# Patient Record
Sex: Female | Born: 1990 | Race: White | Hispanic: No | Marital: Single | Smoking: Never smoker
Health system: Southern US, Community
[De-identification: ages and names within clinical notes are randomized; demographics above are authoritative.]

## PROBLEM LIST (undated history)

## (undated) ENCOUNTER — Inpatient Hospital Stay (HOSPITAL_COMMUNITY): Payer: Self-pay

## (undated) DIAGNOSIS — N6320 Unspecified lump in the left breast, unspecified quadrant: Secondary | ICD-10-CM

## (undated) DIAGNOSIS — A630 Anogenital (venereal) warts: Secondary | ICD-10-CM

## (undated) DIAGNOSIS — K219 Gastro-esophageal reflux disease without esophagitis: Secondary | ICD-10-CM

## (undated) DIAGNOSIS — Z8489 Family history of other specified conditions: Secondary | ICD-10-CM

## (undated) DIAGNOSIS — O99619 Diseases of the digestive system complicating pregnancy, unspecified trimester: Secondary | ICD-10-CM

## (undated) DIAGNOSIS — T8859XA Other complications of anesthesia, initial encounter: Secondary | ICD-10-CM

## (undated) DIAGNOSIS — T4145XA Adverse effect of unspecified anesthetic, initial encounter: Secondary | ICD-10-CM

## (undated) HISTORY — PX: WISDOM TOOTH EXTRACTION: SHX21

---

## 2009-11-24 ENCOUNTER — Inpatient Hospital Stay (HOSPITAL_COMMUNITY): Admission: AD | Admit: 2009-11-24 | Discharge: 2009-11-24 | Payer: Self-pay | Admitting: Obstetrics & Gynecology

## 2009-11-24 ENCOUNTER — Emergency Department (HOSPITAL_COMMUNITY): Admission: EM | Admit: 2009-11-24 | Discharge: 2009-11-24 | Payer: Self-pay | Admitting: Family Medicine

## 2009-11-26 ENCOUNTER — Ambulatory Visit (HOSPITAL_COMMUNITY): Admission: RE | Admit: 2009-11-26 | Discharge: 2009-11-26 | Payer: Self-pay | Admitting: Obstetrics and Gynecology

## 2009-12-14 ENCOUNTER — Ambulatory Visit: Payer: Self-pay | Admitting: Obstetrics and Gynecology

## 2010-04-26 ENCOUNTER — Encounter (INDEPENDENT_AMBULATORY_CARE_PROVIDER_SITE_OTHER): Payer: Self-pay | Admitting: *Deleted

## 2010-04-26 ENCOUNTER — Encounter: Payer: Self-pay | Admitting: Advanced Practice Midwife

## 2010-04-26 LAB — CONVERTED CEMR LAB
ALT: 74 units/L — ABNORMAL HIGH (ref 0–35)
Alkaline Phosphatase: 59 units/L (ref 39–117)
Basophils Relative: 0 % (ref 0–1)
Eosinophils Absolute: 0.2 10*3/uL (ref 0.0–0.7)
Hepatitis B Surface Ag: NEGATIVE
Indirect Bilirubin: 0.2 mg/dL (ref 0.0–0.9)
MCHC: 33.5 g/dL (ref 30.0–36.0)
MCV: 89.7 fL (ref 78.0–100.0)
Neutrophils Relative %: 66 % (ref 43–77)
Platelets: 294 10*3/uL (ref 150–400)
Total Protein: 7 g/dL (ref 6.0–8.3)
Trich, Wet Prep: NONE SEEN
WBC: 8.5 10*3/uL (ref 4.0–10.5)
Yeast Wet Prep HPF POC: NONE SEEN

## 2010-04-28 ENCOUNTER — Inpatient Hospital Stay (HOSPITAL_COMMUNITY): Admission: AD | Admit: 2010-04-28 | Discharge: 2010-04-28 | Payer: Self-pay | Admitting: Obstetrics and Gynecology

## 2010-05-31 ENCOUNTER — Observation Stay (HOSPITAL_COMMUNITY)
Admission: AD | Admit: 2010-05-31 | Discharge: 2010-06-03 | Payer: Self-pay | Source: Home / Self Care | Admitting: Obstetrics and Gynecology

## 2010-06-12 ENCOUNTER — Inpatient Hospital Stay (HOSPITAL_COMMUNITY)
Admission: AD | Admit: 2010-06-12 | Discharge: 2010-06-15 | Payer: Self-pay | Source: Home / Self Care | Admitting: Obstetrics and Gynecology

## 2010-07-08 ENCOUNTER — Inpatient Hospital Stay (HOSPITAL_COMMUNITY)
Admission: AD | Admit: 2010-07-08 | Discharge: 2010-07-08 | Payer: Self-pay | Source: Home / Self Care | Attending: Obstetrics and Gynecology | Admitting: Obstetrics and Gynecology

## 2010-08-10 ENCOUNTER — Encounter (INDEPENDENT_AMBULATORY_CARE_PROVIDER_SITE_OTHER): Payer: Self-pay | Admitting: *Deleted

## 2010-08-10 ENCOUNTER — Encounter: Payer: Self-pay | Admitting: Obstetrics & Gynecology

## 2010-08-10 LAB — CONVERTED CEMR LAB
Amphetamine Screen, Ur: NEGATIVE
Benzodiazepines.: NEGATIVE
HCT: 34.2 % — ABNORMAL LOW (ref 36.0–46.0)
Hemoglobin: 11.7 g/dL — ABNORMAL LOW (ref 12.0–15.0)
MCHC: 34.2 g/dL (ref 30.0–36.0)
Marijuana Metabolite: NEGATIVE
Methadone: POSITIVE — AB
RDW: 13.6 % (ref 11.5–15.5)

## 2010-08-25 ENCOUNTER — Encounter (HOSPITAL_COMMUNITY)
Admission: RE | Admit: 2010-08-25 | Discharge: 2010-08-25 | Disposition: A | Discharge status: Home / Self Care | Payer: PRIVATE HEALTH INSURANCE | Source: Ambulatory Visit | Attending: Obstetrics and Gynecology | Admitting: Obstetrics and Gynecology

## 2010-08-25 DIAGNOSIS — Z01812 Encounter for preprocedural laboratory examination: Secondary | ICD-10-CM | POA: Insufficient documentation

## 2010-08-25 LAB — CBC
HCT: 36.9 % (ref 36.0–46.0)
MCH: 27.2 pg (ref 26.0–34.0)
MCV: 86.6 fL (ref 78.0–100.0)
Platelets: 265 10*3/uL (ref 150–400)
RBC: 4.26 MIL/uL (ref 3.87–5.11)

## 2010-09-01 ENCOUNTER — Inpatient Hospital Stay (HOSPITAL_COMMUNITY)
Admission: RE | Admit: 2010-09-01 | Discharge: 2010-09-04 | DRG: 766 | Disposition: A | Discharge status: Home / Self Care | Payer: PRIVATE HEALTH INSURANCE | Source: Ambulatory Visit | Attending: Obstetrics and Gynecology | Admitting: Obstetrics and Gynecology

## 2010-09-01 DIAGNOSIS — O98519 Other viral diseases complicating pregnancy, unspecified trimester: Principal | ICD-10-CM | POA: Diagnosis present

## 2010-09-01 DIAGNOSIS — A630 Anogenital (venereal) warts: Secondary | ICD-10-CM | POA: Diagnosis present

## 2010-09-01 LAB — TYPE AND SCREEN
ABO/RH(D): O POS
Antibody Screen: NEGATIVE

## 2010-09-02 LAB — CBC
MCV: 86.4 fL (ref 78.0–100.0)
Platelets: 204 10*3/uL (ref 150–400)
RBC: 3.54 MIL/uL — ABNORMAL LOW (ref 3.87–5.11)
WBC: 10.9 10*3/uL — ABNORMAL HIGH (ref 4.0–10.5)

## 2010-09-06 NOTE — H&P (Signed)
  [name], [name]               ACCOUNT NO.:  [id number]  MEDICAL RECORD NO.:  [id number]           PATIENT TYPE:  LOCATION:                                 FACILITY:  PHYSICIAN:  Duke Salvia. Marcelle Overlie, M.D.DATE OF BIRTH:  [birth year]  DATE OF ADMISSION: DATE OF DISCHARGE:                             HISTORY & PHYSICAL   Date of scheduled surgery is September 01, 2010.  CHIEF COMPLAINT:  Extensive vulvar condyloma, for primary cesarean section.  HISTORY OF PRESENT ILLNESS:  A 20 year old G2, P0-0-1-0, EDD is September 08, 2010.  This patient has had concerns early in the pregnancy of having a significant chronic subchorionic hemorrhage/placental abruption that has been followed during her pregnancy with weekly nonstress tests and serial ultrasounds at the last ultrasound on July 27, 2010, was noted to have BPP 8/8 with resolution of any retroplacental clot.  Her bigger issue has been extensive vulvar and vaginal condyloma which have worsened through the course of the pregnancy.  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  We discussed with her the option, and she has a strong preference to proceed with primary cesarean section.  This procedure including risks related to bleeding, infection, adjacent organ injury, other risks issues related to phlebitis, wound infection along with her expected recovery time discussed which she understands and accepts.  PAST MEDICAL HISTORY:  Please see her [facility] form for details.  Blood type is O positive.  PHYSICAL EXAMINATION:  VITAL SIGNS:  Temperature 98.2 and blood pressure 106/70. HEENT:  Unremarkable. NECK:  Supple without masses. LUNGS:  Clear. CARDIOVASCULAR:  Rate and rhythm without murmurs, rubs, or gallops. BREASTS:  Without masses. Term fundal height.   Fetal heart rate 140.  Vulva revealed extensive condyloma.  There were also vaginal condyloma noted.  Cervix is closed. EXTREMITIES:  Unremarkable. NEUROLOGIC:  Unremarkable.  IMPRESSION: 1. Term intrauterine pregnancy. 2. History of early pregnancy subchorionic hemorrhage/placental     abruption, now resolved. 3. Extensive vulvar/vaginal condyloma.  PLAN:  Primary cesarean section.  Risks were reviewed as above.     Nechuma Boven M. Marcelle Overlie, M.D.     RMH/MEDQ  D:  08/28/2010  T:  08/29/2010  Job:  [id number]  Electronically Signed by Richarda Overlie M.D. on 09/06/2010 06:39:34 PM

## 2010-09-06 NOTE — Op Note (Signed)
  NAMEJUSTIN, [name]               ACCOUNT NO.:  [id number]  MEDICAL RECORD NO.:  [id number]           PATIENT TYPE:  I  LOCATION:  9122                          FACILITY:  [facility]  PHYSICIAN:  Duke Salvia. Marcelle Overlie, M.D.DATE OF BIRTH:  October 24, 1990  DATE OF PROCEDURE: DATE OF DISCHARGE:  08/10/2010                              OPERATIVE REPORT   PREOPERATIVE DIAGNOSES: 1. Term intrauterine pregnancy. 2. Extensive vaginal and vulvar condyloma, the patient elects for     primary cesarean section.  POSTOPERATIVE DIAGNOSES: 1. Term intrauterine pregnancy. 2. Extensive vaginal and vulvar condyloma, the patient elects for     primary cesarean section.  PROCEDURE:  Primary low transverse cesarean section.  SURGEON:  Duke Salvia. Marcelle Overlie, MD  ANESTHESIA:  Spinal.  COMPLICATIONS:  None.  DRAINS:  Foley catheter.  BLOOD LOSS:  600 mL.  PROCEDURE AND FINDINGS:  The patient taken to the operating room after an adequate level of spinal anesthetic was obtained with the patient in left tilt position, the abdomen was prepped and draped in usual manner for sterile bowel procedures.  Foley catheter positioned draining clear urine.  Two finger breaths above the symphysis.  A transverse incision was made, carried down to the fascia which was then extended transversely.  Rectus muscle divided in the midline.  Peritoneum entered superiorly without incident and extended vertically.  Bladder blade was positioned.  The vesicouterine serosa incised and sharply dissected below, bladder blade repositioned.  Transverse incision made in the lower segment, extended with blunt dissection.  Clear fluid noted.  The patient delivered of a healthy female, 6 pounds 0 ounces, Apgars 9 and 9.  The infant was suctioned, cord clamped and passed to pediatric team for further care.  The placenta was then delivered manually intact, uterus exteriorized, cavity wiped clean with laparotomy pack.  Closure obtained with  first layer of 0 chromic in locked fashion followed by an imbricating layer with 0 chromic.  The bladder flap area was inspected and noted to be intact and hemostatic.  Bilateral tubes and ovaries were normal.  Prior to closure; sponge, needle and instrument counts reported as correct x2.  Peritoneum closed with running 3-0 Vicryl suture which was used to incorporate closure of the rectus muscles in the midline.  A 0 PDS was then used to close the fascia from laterally to midline on either side.  Subcutaneous tissue was irrigated and was closed with running 3-0 Vicryl suture to close dead space.  A 4-0 Monocryl subcuticular suture used for the skin.  She tolerated this well, went to recovery room in good condition.     Ambree Frances M. Marcelle Overlie, M.D.     RMH/MEDQ  D:  09/01/2010  T:  09/02/2010  Job:  [id number]  Electronically Signed by Richarda Overlie M.D. on 09/06/2010 06:39:31 PM

## 2010-09-07 NOTE — Discharge Summary (Signed)
  NAMEMARLO, [name]               ACCOUNT NO.:  [id number]  MEDICAL RECORD NO.:  [id number]           PATIENT TYPE:  I  LOCATION:  9122                          [facility]ACILITY:  [facility]  PHYSICIAN:  Zelphia Cairo, MD    DATE OF BIRTH:  18-Nov-1990  DATE OF ADMISSION:  09/01/2010 DATE OF DISCHARGE:  09/04/2010                              DISCHARGE SUMMARY   ADMITTING DIAGNOSES: 1. Intrauterine pregnancy at term. 2. Extensive vulvar condyloma, requests cesarean delivery.  DISCHARGE DIAGNOSES: 1. Status post low transverse cesarean section. 2. Viable female infant.  PROCEDURE:  Primary low transverse cesarean section.  REASON FOR ADMISSION:  Please see dictated H and P.  HOSPITAL COURSE:  The patient is a 20 year old white female gravida 2, para 0 that was admitted to [facility] at term for scheduled cesarean section.  The patient did have known extensive vulvar condyloma, had requested cesarean delivery.  On the morning of admission, the patient was taken to the operating room where spinal anesthesia was administered without difficulty.  A low transverse incision was made with the delivery of a viable female infant, weighing 6 pounds and 0 ounces with Apgars of 9 at 1 minute and 9 at 5 minutes. The patient tolerated the procedure well and was taken to the recovery room in stable condition.  On the following morning, the patient was without complaint.  Vital signs were stable.  She was afebrile.  Abdomen was soft with good return of bowel function.  Fundus was firm and nontender.  Abdominal dressing was noted to be clean, dry, and intact. Laboratory findings showed hemoglobin of 9.7.  On postoperative day #2, the patient was without complaint.  Vital signs were stable.  Fundus was firm and nontender.  Abdominal dressing had been removed, revealing an incision that was clean, dry and intact.  On postoperative day #3, the patient was without complaint.  Vital signs were  stable.  She was afebrile.  Fundus was firm and nontender.  Incision was clean, dry, and intact.  Discharge instructions were reviewed and the patient was later discharged home.  CONDITION ON DISCHARGE:  Stable.  DIET:  Regular as tolerated.  ACTIVITY:  No heavy lifting, no driving x2 weeks, and no vaginal entry.  FOLLOWUP:  The patient is to follow up in the office in 1-2 weeks for an incision check.  She is to call for temperature greater than 100 degrees, persistent nausea, vomiting, heavy vaginal bleeding, and/or redness or drainage from an incisional site.  DISCHARGE MEDICATIONS: 1. Tylox #30 one p.o. every 4-6 hours p.r.n. 2. Motrin 600 mg every 6 hours. 3. Prenatal vitamins 1 p.o. daily. 4. Colace 1 p.o. daily.     Julio Sicks, N.P.   ______________________________ Zelphia Cairo, MD    CC/MEDQ  D:  09/04/2010  T:  09/04/2010  Job:  [id number]  Electronically Signed by Julio Sicks N.P. on 09/05/2010 08:40:16 AM Electronically Signed by Zelphia Cairo MD on 09/07/2010 19:14:78 AM

## 2010-09-25 LAB — CBC
HCT: 33.2 % — ABNORMAL LOW (ref 36.0–46.0)
Hemoglobin: 10.9 g/dL — ABNORMAL LOW (ref 12.0–15.0)
RBC: 3.92 MIL/uL (ref 3.87–5.11)
WBC: 13.6 10*3/uL — ABNORMAL HIGH (ref 4.0–10.5)

## 2010-09-26 LAB — COMPREHENSIVE METABOLIC PANEL
Alkaline Phosphatase: 81 U/L (ref 39–117)
BUN: 7 mg/dL (ref 6–23)
CO2: 21 mEq/L (ref 19–32)
Chloride: 109 mEq/L (ref 96–112)
Creatinine, Ser: 0.47 mg/dL (ref 0.4–1.2)
GFR calc non Af Amer: >60 mL/min (ref >60)
Potassium: 3.6 mEq/L (ref 3.5–5.1)
Total Bilirubin: 0.2 mg/dL — ABNORMAL LOW (ref 0.3–1.2)

## 2010-09-26 LAB — CBC
HCT: 32.9 % — ABNORMAL LOW (ref 36.0–46.0)
Hemoglobin: 11 g/dL — ABNORMAL LOW (ref 12.0–15.0)
MCH: 29.9 pg (ref 26.0–34.0)
MCV: 89.6 fL (ref 78.0–100.0)
RBC: 3.67 MIL/uL — ABNORMAL LOW (ref 3.87–5.11)
WBC: 15.2 10*3/uL — ABNORMAL HIGH (ref 4.0–10.5)

## 2010-09-26 LAB — URIC ACID: Uric Acid, Serum: 3.1 mg/dL (ref 2.4–7.0)

## 2010-09-28 LAB — WET PREP, GENITAL: Yeast Wet Prep HPF POC: NONE SEEN

## 2010-10-03 LAB — HCG, QUANTITATIVE, PREGNANCY: hCG, Beta Chain, Quant, S: 123 m[IU]/mL — ABNORMAL HIGH (ref <5)

## 2010-10-03 LAB — WET PREP, GENITAL
Trich, Wet Prep: NONE SEEN
Yeast Wet Prep HPF POC: NONE SEEN

## 2010-10-03 LAB — CBC
HCT: 38.9 % (ref 36.0–46.0)
Hemoglobin: 13.4 g/dL (ref 12.0–15.0)
MCV: 84.7 fL (ref 78.0–100.0)
Platelets: 306 10*3/uL (ref 150–400)
WBC: 9.9 10*3/uL (ref 4.0–10.5)

## 2010-10-03 LAB — ABO/RH: ABO/RH(D): O POS

## 2011-06-12 IMAGING — US US OB TRANSVAGINAL MODIFY
1 series · 14 of 28 positions shown · non-contrast
Comparison: None.

CLINICAL DATA: Pregnant with vaginal bleeding.  Possible
miscarriage.

OBSTETRIC <14 WK ULTRASOUND
TECHNIQUE: Transabdominal ultrasound was performed for evaluation
of the gestation as well as the maternal uterus and adnexal
regions.

[Series 1: us ob comp less 14 wks · 0.19mm/px · 14 of 46 slices shown]
[im 2/46]
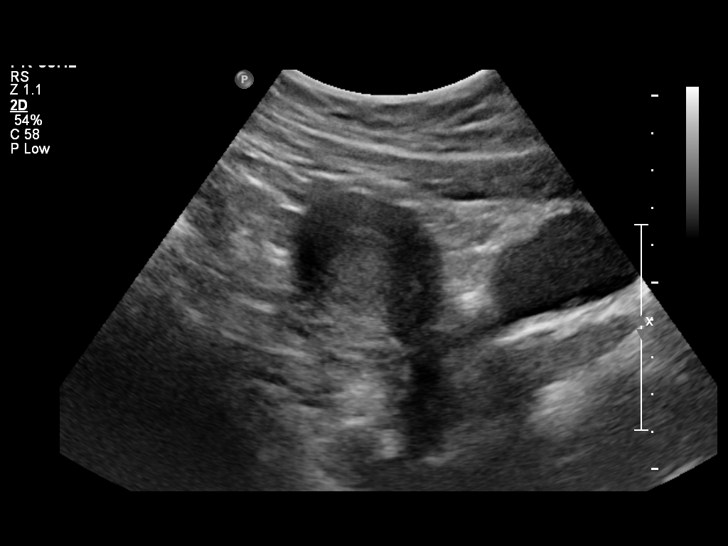
[im 6/46]
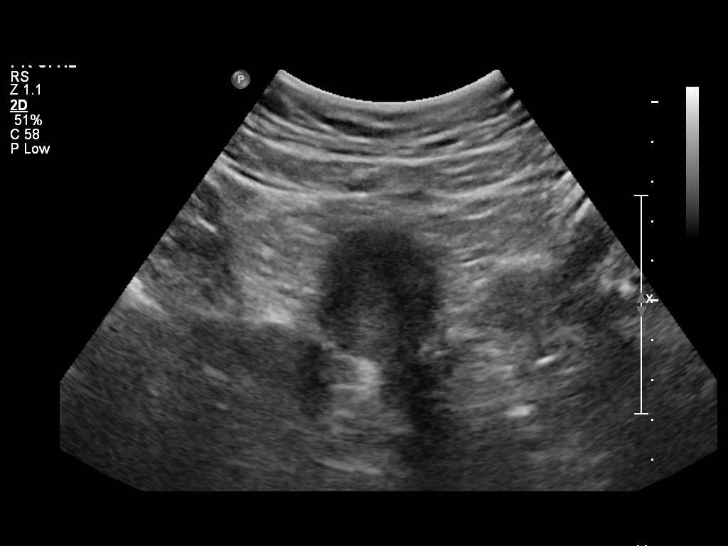
[im 9/46]
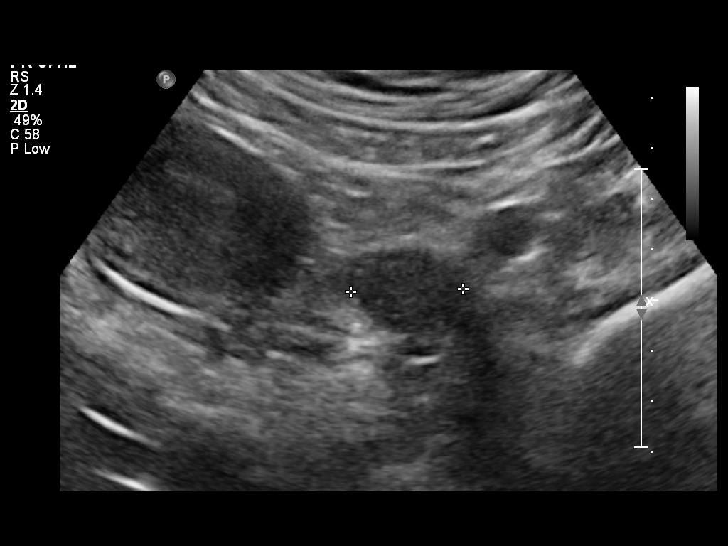
[im 12/46]
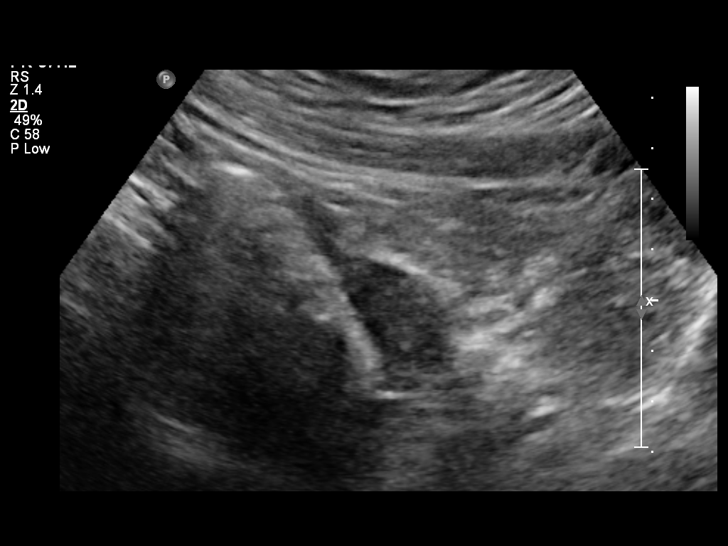
[im 16/46]
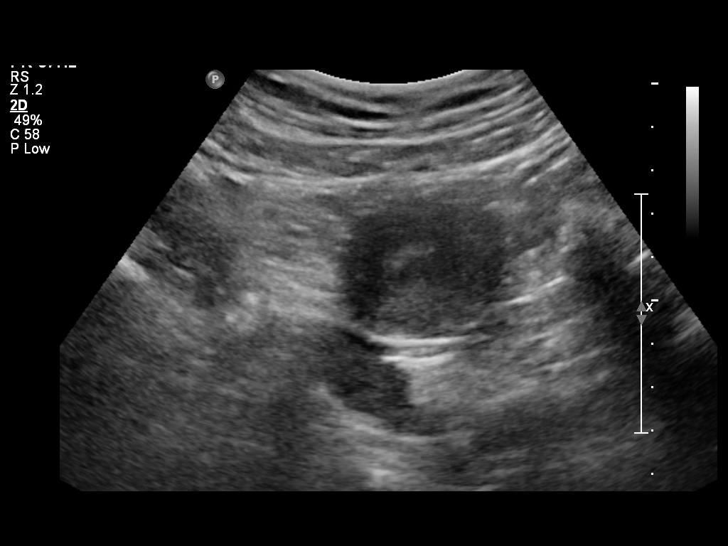
[im 19/46]
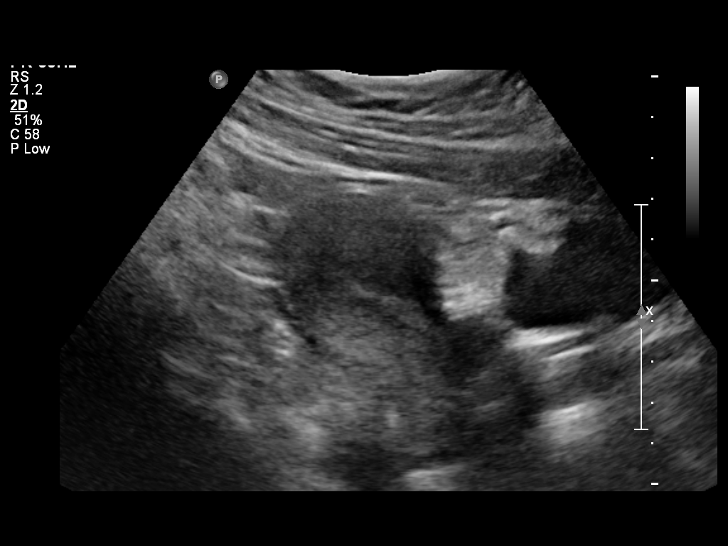
[im 22/46]
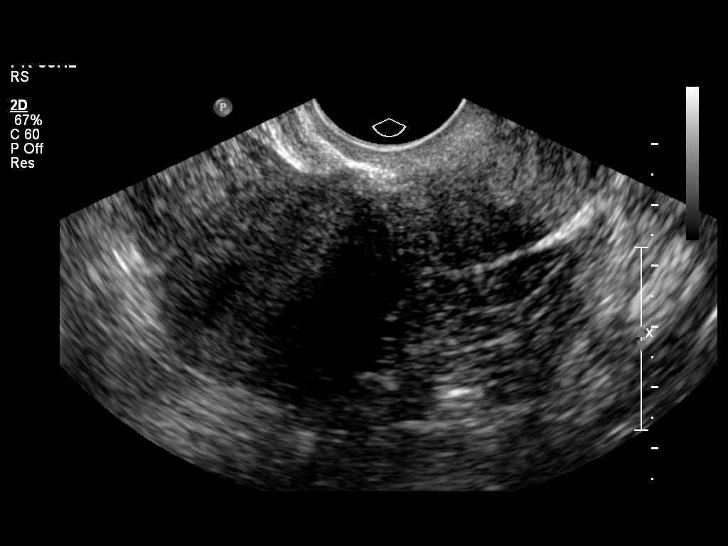
[im 26/46]
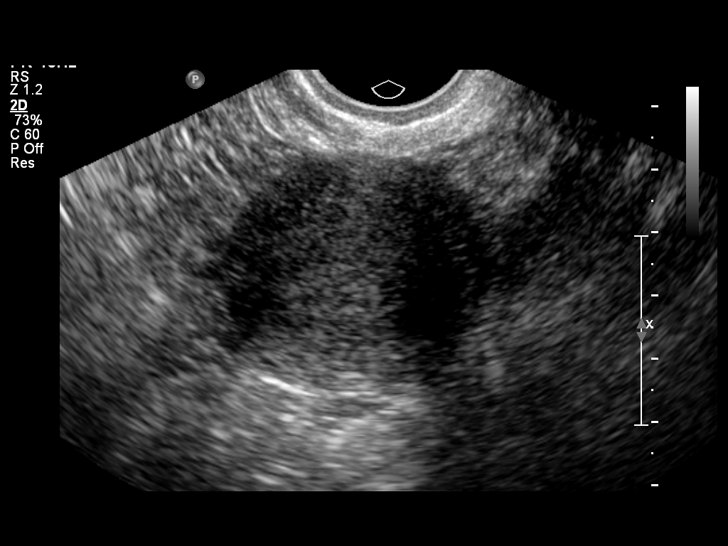
[im 29/46]
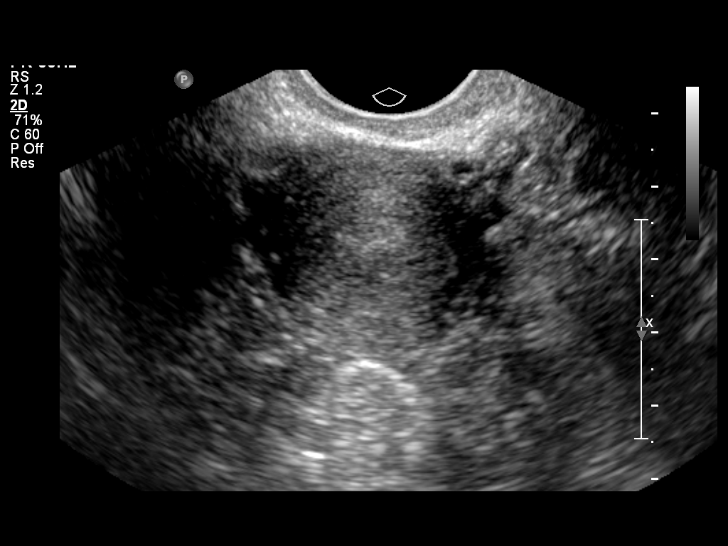
[im 32/46]
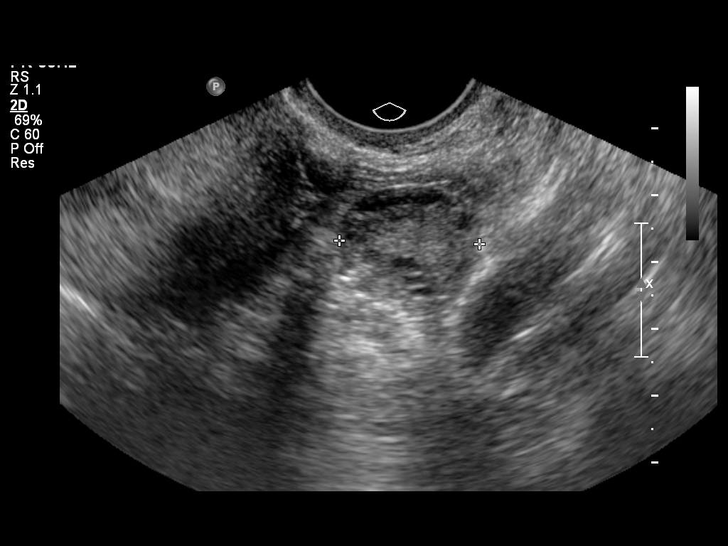
[im 36/46]
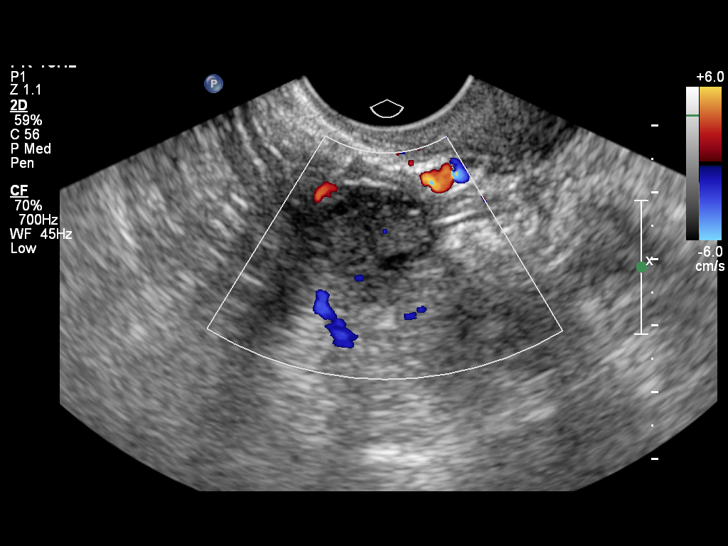
[im 39/46]
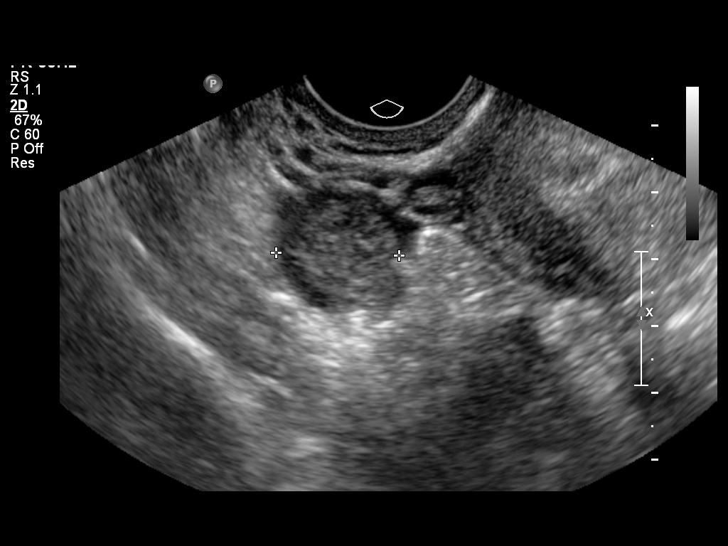
[im 42/46]
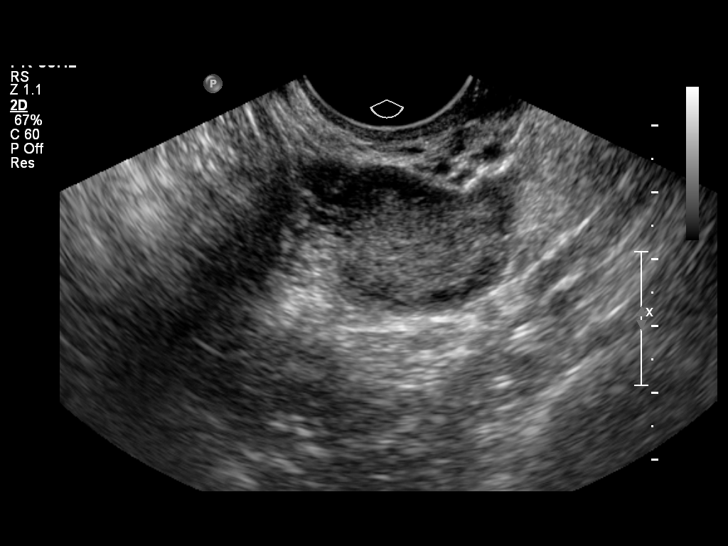
[im 46/46]
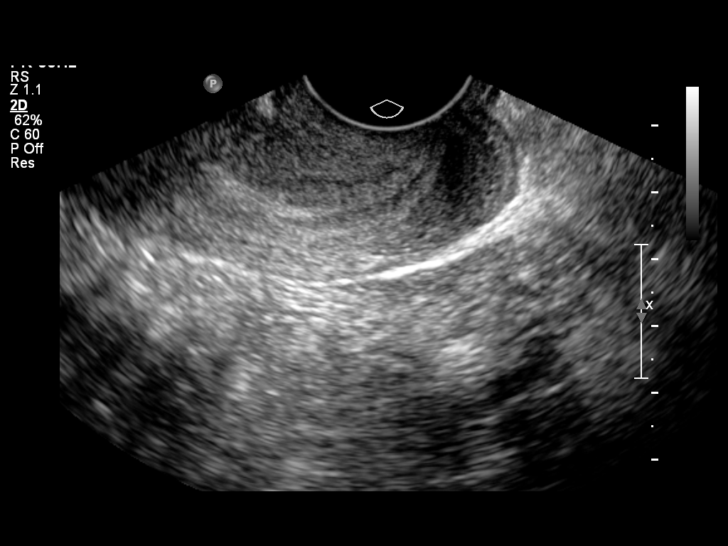

[14 of 28 positions shown; findings below may reference images not displayed]

Intrauterine gestational sac: Not present
Yolk sac: Not present
Embryo: Not present
Cardiac Activity: Not present
Heart Rate: Not present

Maternal uterus/Adnexae:
Corpus luteum cyst on the right ovary.  Left ovary is normal.
There is no free fluid.  The uterus appears normal.
IMPRESSION: Negative for intrauterine pregnancy.  This may be due to
spontaneous abortion.  Ectopic and early IUP  not excluded.
Correlation with B HCG is suggested.

## 2012-01-03 ENCOUNTER — Inpatient Hospital Stay: Admission: RE | Admit: 2012-01-03 | Payer: PRIVATE HEALTH INSURANCE | Source: Ambulatory Visit

## 2012-01-03 ENCOUNTER — Other Ambulatory Visit: Payer: Self-pay | Admitting: Obstetrics and Gynecology

## 2012-01-03 DIAGNOSIS — N649 Disorder of breast, unspecified: Secondary | ICD-10-CM

## 2012-01-04 ENCOUNTER — Ambulatory Visit
Admission: RE | Admit: 2012-01-04 | Discharge: 2012-01-04 | Disposition: A | Discharge status: Home / Self Care | Payer: Commercial Managed Care - PPO | Source: Ambulatory Visit | Attending: Obstetrics and Gynecology | Admitting: Obstetrics and Gynecology

## 2012-01-04 ENCOUNTER — Other Ambulatory Visit: Payer: Self-pay | Admitting: Obstetrics and Gynecology

## 2012-01-04 DIAGNOSIS — N649 Disorder of breast, unspecified: Secondary | ICD-10-CM

## 2012-01-07 ENCOUNTER — Other Ambulatory Visit: Payer: Commercial Managed Care - PPO

## 2012-01-10 ENCOUNTER — Ambulatory Visit
Admission: RE | Admit: 2012-01-10 | Discharge: 2012-01-10 | Disposition: A | Discharge status: Home / Self Care | Payer: Commercial Managed Care - PPO | Source: Ambulatory Visit | Attending: Obstetrics and Gynecology | Admitting: Obstetrics and Gynecology

## 2012-01-10 DIAGNOSIS — N649 Disorder of breast, unspecified: Secondary | ICD-10-CM

## 2012-01-23 ENCOUNTER — Encounter (HOSPITAL_COMMUNITY): Payer: Self-pay | Admitting: *Deleted

## 2012-01-28 NOTE — H&P (Signed)
[name]  DICTATION # [id number] CSN# [id number]   Meriel Pica, MD 01/28/2012 9:21 AM

## 2012-01-29 NOTE — H&P (Signed)
[name], [name]               ACCOUNT NO.:  [id number]  MEDICAL RECORD NO.:  [id number]  LOCATION:                                 FACILITY:  PHYSICIAN:  Duke Salvia. Marcelle Overlie, M.D.DATE OF BIRTH:  1991-01-30  DATE OF ADMISSION: DATE OF DISCHARGE:                             HISTORY & PHYSICAL   CHIEF COMPLAINT:  Recurrent vulvar condyloma.  HISTORY OF PRESENT ILLNESS:  A 21 year old, G2, P1, not currently sexually active.  This patient has a history of vulvar condyloma, treated both during her pregnancy and in the postpartum period, most recently with Veregen which did not seem to help, but she could not tolerate due to skin irritation also.  Most recent exams of multiple supraclitoral and fourchette condyloma. She presents at this time for CO2 ablation of vulvar condyloma, recurrent.  This procedure including risks related to bleeding, infection, recurrence of other condyloma along with her expected recovery time discussed.  Last Pap April 2013, showed ASCUS.  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 She did have a cryo treatment of her cervix November 2012.  PAST MEDICAL HISTORY:  Allergies:  None. Current Medications:  None.  REVIEW OF SYSTEMS:  Significant for a history of treated Chlamydia and condyloma, history of abnormal Pap smear.  FAMILY HISTORY:  Significant for thyroid disease, gallbladder disease, UTI, osteoporosis, arthritis, diabetes.  SOCIAL HISTORY:  Denies alcohol, tobacco, or drug use.  She is single, not currently sexually active.  PHYSICAL EXAMINATION:  VITAL SIGNS:  Temp 98.2, blood pressure 110/72. HEENT:  Unremarkable. NECK:  Supple without masses. LUNGS:  Clear. CARDIOVASCULAR:  Regular rate and rhythm without murmurs, rubs, or gallops. BREASTS:  Without masses. ABDOMEN:  Soft, flat, nontender. PELVIC:  There are multiple small supraclitoral and fourchette condyloma noted on exam.  Vagina and cervix otherwise negative.  Uterus mid position,  nontender.  Adnexa negative. EXTREMITIES:  Unremarkable. NEUROLOGIC:  Unremarkable.  IMPRESSION:  Recurrent vulvar condyloma.  PLAN:  CO2 laser ablation.  Procedure and risks discussed as above.     Money Mckeithan M. Marcelle Overlie, M.D.     RMH/MEDQ  D:  01/28/2012  T:  01/29/2012  Job:  [id number]

## 2012-02-08 ENCOUNTER — Encounter (HOSPITAL_COMMUNITY): Payer: Self-pay | Admitting: Anesthesiology

## 2012-02-08 ENCOUNTER — Encounter (HOSPITAL_COMMUNITY)
Admission: RE | Disposition: A | Discharge status: Home / Self Care | Payer: Self-pay | Source: Ambulatory Visit | Attending: Obstetrics and Gynecology

## 2012-02-08 ENCOUNTER — Ambulatory Visit (HOSPITAL_COMMUNITY): Payer: Commercial Managed Care - PPO | Admitting: Anesthesiology

## 2012-02-08 ENCOUNTER — Encounter (HOSPITAL_COMMUNITY): Payer: Self-pay | Admitting: *Deleted

## 2012-02-08 ENCOUNTER — Ambulatory Visit (HOSPITAL_COMMUNITY)
Admission: RE | Admit: 2012-02-08 | Discharge: 2012-02-08 | Disposition: A | Discharge status: Home / Self Care | Payer: Commercial Managed Care - PPO | Source: Ambulatory Visit | Attending: Obstetrics and Gynecology | Admitting: Obstetrics and Gynecology

## 2012-02-08 DIAGNOSIS — A630 Anogenital (venereal) warts: Secondary | ICD-10-CM

## 2012-02-08 HISTORY — DX: Anogenital (venereal) warts: A63.0

## 2012-02-08 HISTORY — PX: LASER ABLATION CONDOLAMATA: SHX5941

## 2012-02-08 LAB — CBC
HCT: 37.1 % (ref 36.0–46.0)
MCH: 27 pg (ref 26.0–34.0)
MCHC: 32.3 g/dL (ref 30.0–36.0)
MCV: 83.4 fL (ref 78.0–100.0)
RDW: 13.3 % (ref 11.5–15.5)

## 2012-02-08 SURGERY — ABLATION, CONDYLOMA, USING LASER
Anesthesia: General

## 2012-02-08 MED ORDER — FENTANYL CITRATE 0.05 MG/ML IJ SOLN
INTRAMUSCULAR | Status: AC
  Filled 2012-02-08: qty 5

## 2012-02-08 MED ORDER — CEFAZOLIN SODIUM-DEXTROSE 2-3 GM-% IV SOLR
2.0000 g | INTRAVENOUS | Status: AC
  Administered 2012-02-08: 2 g via INTRAVENOUS

## 2012-02-08 MED ORDER — GLYCOPYRROLATE 0.2 MG/ML IJ SOLN
INTRAMUSCULAR | Status: AC
  Filled 2012-02-08: qty 1

## 2012-02-08 MED ORDER — MIDAZOLAM HCL 5 MG/5ML IJ SOLN
INTRAMUSCULAR | Status: DC | PRN
  Administered 2012-02-08: 2 mg via INTRAVENOUS

## 2012-02-08 MED ORDER — FENTANYL CITRATE 0.05 MG/ML IJ SOLN
INTRAMUSCULAR | Status: DC | PRN
  Administered 2012-02-08 (×2): 100 ug via INTRAVENOUS

## 2012-02-08 MED ORDER — LIDOCAINE HCL (CARDIAC) 20 MG/ML IV SOLN
INTRAVENOUS | Status: DC | PRN
  Administered 2012-02-08: 50 mg via INTRAVENOUS

## 2012-02-08 MED ORDER — MIDAZOLAM HCL 2 MG/2ML IJ SOLN
INTRAMUSCULAR | Status: AC
  Filled 2012-02-08: qty 2

## 2012-02-08 MED ORDER — SILVER SULFADIAZINE 1 % EX CREA
TOPICAL_CREAM | CUTANEOUS | Status: AC
  Filled 2012-02-08: qty 50

## 2012-02-08 MED ORDER — LACTATED RINGERS IV SOLN
INTRAVENOUS | Status: DC
  Administered 2012-02-08: 100 mL/h via INTRAVENOUS
  Administered 2012-02-08: 08:00:00 via INTRAVENOUS

## 2012-02-08 MED ORDER — PROPOFOL 10 MG/ML IV EMUL
INTRAVENOUS | Status: DC | PRN
  Administered 2012-02-08: 200 mg via INTRAVENOUS

## 2012-02-08 MED ORDER — CEFAZOLIN SODIUM-DEXTROSE 2-3 GM-% IV SOLR
INTRAVENOUS | Status: AC
  Filled 2012-02-08: qty 50

## 2012-02-08 MED ORDER — GLYCOPYRROLATE 0.2 MG/ML IJ SOLN
INTRAMUSCULAR | Status: DC | PRN
  Administered 2012-02-08: 0.2 mg via INTRAVENOUS

## 2012-02-08 MED ORDER — ONDANSETRON HCL 4 MG/2ML IJ SOLN
INTRAMUSCULAR | Status: AC
  Filled 2012-02-08: qty 2

## 2012-02-08 MED ORDER — HYDROCODONE-IBUPROFEN 7.5-200 MG PO TABS: 1.0000 | ORAL_TABLET | Freq: Three times a day (TID) | ORAL | Status: AC | PRN

## 2012-02-08 MED ORDER — FENTANYL CITRATE 0.05 MG/ML IJ SOLN: 25.0000 ug | INTRAMUSCULAR | Status: DC | PRN

## 2012-02-08 MED ORDER — ONDANSETRON HCL 4 MG/2ML IJ SOLN
INTRAMUSCULAR | Status: DC | PRN
  Administered 2012-02-08: 4 mg via INTRAVENOUS

## 2012-02-08 MED ORDER — KETOROLAC TROMETHAMINE 30 MG/ML IJ SOLN
INTRAMUSCULAR | Status: AC
  Filled 2012-02-08: qty 1

## 2012-02-08 MED ORDER — LIDOCAINE HCL (CARDIAC) 20 MG/ML IV SOLN
INTRAVENOUS | Status: AC
  Filled 2012-02-08: qty 5

## 2012-02-08 MED ORDER — SILVER NITRATE-POT NITRATE 75-25 % EX MISC
CUTANEOUS | Status: AC
  Filled 2012-02-08: qty 1

## 2012-02-08 MED ORDER — KETOROLAC TROMETHAMINE 30 MG/ML IJ SOLN
INTRAMUSCULAR | Status: DC | PRN
  Administered 2012-02-08: 30 mg via INTRAVENOUS

## 2012-02-08 MED ORDER — PROPOFOL 10 MG/ML IV EMUL
INTRAVENOUS | Status: AC
  Filled 2012-02-08: qty 20

## 2012-02-08 MED ORDER — CHLOROPROCAINE HCL 1 % IJ SOLN
INTRAMUSCULAR | Status: AC
  Filled 2012-02-08: qty 30

## 2012-02-08 MED ORDER — METOCLOPRAMIDE HCL 5 MG/ML IJ SOLN: 10.0000 mg | Freq: Once | INTRAMUSCULAR | Status: DC | PRN

## 2012-02-08 SURGICAL SUPPLY — 20 items
APPLICATOR COTTON TIP 6IN STRL (MISCELLANEOUS) ×2 IMPLANT
CLOTH BEACON ORANGE TIMEOUT ST (SAFETY) ×2 IMPLANT
CONTAINER PREFILL 10% NBF 60ML (FORM) IMPLANT
DEPRESSOR TONGUE BLADE WOOD (MISCELLANEOUS) ×2 IMPLANT
EVACUATOR PREFILTER SMOKE (MISCELLANEOUS) ×2 IMPLANT
GAUZE SPONGE 4X4 16PLY XRAY LF (GAUZE/BANDAGES/DRESSINGS) IMPLANT
GLOVE BIO SURGEON STRL SZ7 (GLOVE) ×4 IMPLANT
GOWN PREVENTION PLUS LG XLONG (DISPOSABLE) ×4 IMPLANT
HOSE NS SMOKE EVAC 7/8 X6 (MISCELLANEOUS) ×2 IMPLANT
NDL SPNL 22GX3.5 QUINCKE BK (NEEDLE) ×1 IMPLANT
NEEDLE SPNL 22GX3.5 QUINCKE BK (NEEDLE) ×2 IMPLANT
NS IRRIG 1000ML POUR BTL (IV SOLUTION) ×2 IMPLANT
PACK VAGINAL MINOR WOMEN LF (CUSTOM PROCEDURE TRAY) ×2 IMPLANT
REDUCER FITTING SMOKE EVAC (MISCELLANEOUS) ×2 IMPLANT
SCOPETTES 8  STERILE (MISCELLANEOUS) ×2
SCOPETTES 8 STERILE (MISCELLANEOUS) ×2 IMPLANT
SYR CONTROL 10ML LL (SYRINGE) ×2 IMPLANT
TOWEL OR 17X24 6PK STRL BLUE (TOWEL DISPOSABLE) ×4 IMPLANT
TUBING SMOKE EVAC HOSE ADAPTER (MISCELLANEOUS) ×2 IMPLANT
WATER STERILE IRR 1000ML POUR (IV SOLUTION) ×2 IMPLANT

## 2012-02-08 NOTE — Transfer of Care (Signed)
Immediate Anesthesia Transfer of Care Note  Patient: [name]  Procedure(s) Performed: Procedure(s) (LRB): LASER ABLATION CONDOLAMATA (N/A)  Patient Location: PACU  Anesthesia Type: General  Level of Consciousness: awake, alert  and oriented  Airway & Oxygen Therapy: Patient Spontanous Breathing and Patient connected to nasal cannula oxygen  Post-op Assessment: Report given to PACU RN and Post -op Vital signs reviewed and stable  Post vital signs: Reviewed and stable  Complications: No apparent anesthesia complications

## 2012-02-08 NOTE — Progress Notes (Signed)
The patient was re-examined with no change in status 

## 2012-02-08 NOTE — Op Note (Signed)
Preoperative diagnosis: Vulvar condyloma  Postoperative diagnosis: Same  Procedure: CO2 laser vaporization of vulvar condyloma  Surgeon: Marcelle Overlie  Anesthesia: Gen.  Complications: None  Drains: In and out Foley catheter  EBL: Minimal  Procedure and findings:  The patient taken the operating room after an adequate level of general anesthesia was obtained with the patient's legs in stirrups the perineum initially prepped with Betadine and then rinsed with saline, appropriate timeout for taken at that point.  CO2 laser was articulated to the operating microscope complete survey of the vulvar area from supraclitoral down to the perianal area revealed a number of supraclitoral condyloma several at the fourchette and in her thigh along with the perianal area. In a systematic fashion starting at the top the CO2 laser was used to ablate existing condyloma down to the existing skin level this was done at a setting of 15 W with a defocused beam. Careful survey and treatment of condyloma in a clockwise fashion was carried out. When no further condyloma could be noted for treatment the procedure was stopped. Silvadene cream was applied to the treated area she tolerated this well went to recovery room in good condition.  Dictated with [facility]  Shamell Suarez M. Milana Obey.D.

## 2012-02-08 NOTE — Anesthesia Preprocedure Evaluation (Addendum)
Anesthesia Evaluation  Patient identified by MRN, date of birth, ID band Patient awake    Reviewed: Allergy & Precautions, H&P , NPO status , Patient's Chart, lab work & pertinent test results  Airway Mallampati: III TM Distance: >3 FB Neck ROM: Full    Dental No notable dental hx. (+) Teeth Intact   Pulmonary neg pulmonary ROS,  breath sounds clear to auscultation        Cardiovascular negative cardio ROS  Rhythm:Regular Rate:Normal     Neuro/Psych negative neurological ROS  negative psych ROS   GI/Hepatic negative GI ROS, Neg liver ROS,   Endo/Other  negative endocrine ROS  Renal/GU negative Renal ROS   Vulvar condyloma    Musculoskeletal negative musculoskeletal ROS (+)   Abdominal (+) + obese,   Peds  Hematology negative hematology ROS (+)   Anesthesia Other Findings   Reproductive/Obstetrics negative OB ROS                          Anesthesia Physical Anesthesia Plan  ASA: I  Anesthesia Plan: General   Post-op Pain Management:    Induction: Intravenous  Airway Management Planned: LMA  Additional Equipment:   Intra-op Plan:   Post-operative Plan: Extubation in OR  Informed Consent: I have reviewed the patients History and Physical, chart, labs and discussed the procedure including the risks, benefits and alternatives for the proposed anesthesia with the patient or authorized representative who has indicated his/her understanding and acceptance.   Dental advisory given  Plan Discussed with: CRNA, Anesthesiologist and Surgeon  Anesthesia Plan Comments:         Anesthesia Quick Evaluation

## 2012-02-08 NOTE — Anesthesia Postprocedure Evaluation (Signed)
Anesthesia Post Note  Patient: [name]  Procedure(s) Performed: Procedure(s) (LRB): LASER ABLATION CONDOLAMATA (N/A)  Anesthesia type: GA  Patient location: PACU  Post pain: Pain level controlled  Post assessment: Post-op Vital signs reviewed  Last Vitals:  Filed Vitals:   02/08/12 0845  BP: 106/64  Pulse: 59  Temp: 36.7 C  Resp: 16    Post vital signs: Reviewed  Level of consciousness: sedated  Complications: No apparent anesthesia complications

## 2012-05-19 ENCOUNTER — Other Ambulatory Visit: Payer: Self-pay | Admitting: Obstetrics and Gynecology

## 2012-07-16 DIAGNOSIS — K219 Gastro-esophageal reflux disease without esophagitis: Secondary | ICD-10-CM

## 2012-07-16 HISTORY — DX: Gastro-esophageal reflux disease without esophagitis: K21.9

## 2012-10-07 ENCOUNTER — Other Ambulatory Visit: Payer: Self-pay | Admitting: Obstetrics and Gynecology

## 2012-10-07 DIAGNOSIS — D242 Benign neoplasm of left breast: Secondary | ICD-10-CM

## 2012-10-09 ENCOUNTER — Other Ambulatory Visit: Payer: Commercial Managed Care - PPO

## 2012-10-14 ENCOUNTER — Ambulatory Visit
Admission: RE | Admit: 2012-10-14 | Discharge: 2012-10-14 | Disposition: A | Discharge status: Home / Self Care | Payer: Commercial Managed Care - PPO | Source: Ambulatory Visit | Attending: Obstetrics and Gynecology | Admitting: Obstetrics and Gynecology

## 2012-10-14 DIAGNOSIS — D242 Benign neoplasm of left breast: Secondary | ICD-10-CM

## 2012-11-07 ENCOUNTER — Ambulatory Visit (INDEPENDENT_AMBULATORY_CARE_PROVIDER_SITE_OTHER): Payer: Commercial Managed Care - PPO | Admitting: General Surgery

## 2012-11-13 DIAGNOSIS — N6320 Unspecified lump in the left breast, unspecified quadrant: Secondary | ICD-10-CM

## 2012-11-13 HISTORY — DX: Unspecified lump in the left breast, unspecified quadrant: N63.20

## 2012-11-14 ENCOUNTER — Encounter (INDEPENDENT_AMBULATORY_CARE_PROVIDER_SITE_OTHER): Payer: Self-pay | Admitting: Surgery

## 2012-11-14 ENCOUNTER — Ambulatory Visit (INDEPENDENT_AMBULATORY_CARE_PROVIDER_SITE_OTHER): Payer: Commercial Managed Care - PPO | Admitting: Surgery

## 2012-11-14 VITALS — BP 118/70 | HR 72 | Temp 98.2°F | Resp 16 | Ht 63.5 in | Wt 200.0 lb

## 2012-11-14 DIAGNOSIS — N63 Unspecified lump in unspecified breast: Secondary | ICD-10-CM

## 2012-11-14 DIAGNOSIS — N6310 Unspecified lump in the right breast, unspecified quadrant: Secondary | ICD-10-CM | POA: Insufficient documentation

## 2012-11-14 NOTE — Progress Notes (Signed)
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 I discussed that with the patient and her mother. An alternative would be continued to follow the area since it has changed only very little since last year by ultrasound criteria. However because of the symptoms the patient is March did have this removed                 CC:  Chief Complaint  Patient presents with  . Breast Mass    Right    HPI:  [name] is a 22 y.o.  female who presents for evaluation of a left breast mass. This noticed about a year ago and a core biopsy was done showing a fibroadenoma. Over the year it has gotten somewhat larger and she is having some discomfort in it. She would like to have it removed surgically. She knows it is also likely benign based on the prior needle core biopsy. She has no other breast symptoms. She has a negative family history for breast cancer.  PMH:  has a past medical history of HPV (human papilloma virus) anogenital infection and Abnormal Pap smear.  PSH:   has past surgical history that includes Wisdom tooth extraction; Cesarean section (2012); and left breast bx.  ALLERGIES:  No Known Allergies  MEDICATIONS: No current outpatient prescriptions on file.  ROS: She has filled out our 12 point review of systems and it is negative . EXAM:   VITAL SIGNS:  BP 118/70  Pulse 72  Temp(Src) 98.2 F (36.8 C) (Oral)  Resp 16  Ht 5' 3.5" (1.613 m)  Wt 200 lb (90.719 kg)  BMI 34.87 kg/m2  GENERAL:  The patient is alert, oriented, and generally healthy-appearing, NAD. Mood and affect are normal.  HEENT:  The head is normocephalic, the eyes nonicteric, the  pupils were round regular and equal. EOMs are normal. Pharynx normal. Dentition good.  NECK:  The neck is supple and there are no masses or thyromegaly.  LUNGS: Normal respirations and clear to auscultation.  HEART: Regular rhythm, with no murmurs rubs or gallops. Pulses are intact carotid dorsalis pedis and posterior tibial. No significant varicosities are noted.  BREASTS:  the breasts are symmetric, somewhat irregular consistent with her age. With difficulty I can appreciate a 2 cm mass at about the 5:00 position of the left is mobile. There is a lot of irregular fibrocystic tissue around it so it is difficult to isolate completely.  ABDOMEN: Soft, flat, and nontender. No masses or organomegaly is noted. No hernias are noted. Bowel sounds are normal.  EXTREMITIES:  Good range of motion, no edema.   DATA REVIEWED:  I have reviewed the REPORTS AND THE PATHOLOGY REPORT FROM HER PRIOR BIOPSY    [name] 11/14/2012  CC: Meriel Pica, MD,  Meriel Pica, MD

## 2012-11-14 NOTE — Patient Instructions (Signed)
We will schedule surgery to remove the small lump from your left breast

## 2012-12-02 ENCOUNTER — Encounter (HOSPITAL_BASED_OUTPATIENT_CLINIC_OR_DEPARTMENT_OTHER): Payer: Self-pay | Admitting: *Deleted

## 2012-12-10 ENCOUNTER — Ambulatory Visit (HOSPITAL_BASED_OUTPATIENT_CLINIC_OR_DEPARTMENT_OTHER): Payer: Commercial Managed Care - PPO | Admitting: Anesthesiology

## 2012-12-10 ENCOUNTER — Other Ambulatory Visit (INDEPENDENT_AMBULATORY_CARE_PROVIDER_SITE_OTHER): Payer: Self-pay | Admitting: Surgery

## 2012-12-10 ENCOUNTER — Encounter (HOSPITAL_BASED_OUTPATIENT_CLINIC_OR_DEPARTMENT_OTHER): Payer: Self-pay | Admitting: Anesthesiology

## 2012-12-10 ENCOUNTER — Ambulatory Visit
Admission: RE | Admit: 2012-12-10 | Discharge: 2012-12-10 | Disposition: A | Discharge status: Home / Self Care | Payer: Commercial Managed Care - PPO | Source: Ambulatory Visit | Attending: Surgery | Admitting: Surgery

## 2012-12-10 ENCOUNTER — Encounter (HOSPITAL_BASED_OUTPATIENT_CLINIC_OR_DEPARTMENT_OTHER)
Admission: RE | Disposition: A | Discharge status: Home / Self Care | Payer: Self-pay | Source: Ambulatory Visit | Attending: Surgery

## 2012-12-10 ENCOUNTER — Ambulatory Visit (HOSPITAL_BASED_OUTPATIENT_CLINIC_OR_DEPARTMENT_OTHER)
Admission: RE | Admit: 2012-12-10 | Discharge: 2012-12-10 | Disposition: A | Discharge status: Home / Self Care | Payer: Commercial Managed Care - PPO | Source: Ambulatory Visit | Attending: Surgery | Admitting: Surgery

## 2012-12-10 ENCOUNTER — Encounter (HOSPITAL_BASED_OUTPATIENT_CLINIC_OR_DEPARTMENT_OTHER): Payer: Self-pay

## 2012-12-10 DIAGNOSIS — Z3201 Encounter for pregnancy test, result positive: Secondary | ICD-10-CM | POA: Insufficient documentation

## 2012-12-10 DIAGNOSIS — D249 Benign neoplasm of unspecified breast: Secondary | ICD-10-CM

## 2012-12-10 DIAGNOSIS — B977 Papillomavirus as the cause of diseases classified elsewhere: Secondary | ICD-10-CM | POA: Insufficient documentation

## 2012-12-10 DIAGNOSIS — N6089 Other benign mammary dysplasias of unspecified breast: Secondary | ICD-10-CM

## 2012-12-10 DIAGNOSIS — N6310 Unspecified lump in the right breast, unspecified quadrant: Secondary | ICD-10-CM

## 2012-12-10 HISTORY — DX: Family history of other specified conditions: Z84.89

## 2012-12-10 HISTORY — DX: Other complications of anesthesia, initial encounter: T88.59XA

## 2012-12-10 HISTORY — DX: Adverse effect of unspecified anesthetic, initial encounter: T41.45XA

## 2012-12-10 HISTORY — PX: BREAST BIOPSY: SHX20

## 2012-12-10 HISTORY — DX: Unspecified lump in the left breast, unspecified quadrant: N63.20

## 2012-12-10 LAB — PREGNANCY, URINE: Preg Test, Ur: POSITIVE — AB

## 2012-12-10 SURGERY — BREAST BIOPSY WITH NEEDLE LOCALIZATION
Anesthesia: General | Site: Breast | Laterality: Left | Wound class: Clean

## 2012-12-10 MED ORDER — MIDAZOLAM HCL 2 MG/2ML IJ SOLN: 1.0000 mg | INTRAMUSCULAR | Status: DC | PRN

## 2012-12-10 MED ORDER — SODIUM BICARBONATE 4 % IV SOLN
INTRAVENOUS | Status: DC | PRN
  Administered 2012-12-10: 14:00:00 via INTRAMUSCULAR

## 2012-12-10 MED ORDER — CHLORHEXIDINE GLUCONATE 4 % EX LIQD: 1.0000 "application " | Freq: Once | CUTANEOUS | Status: DC

## 2012-12-10 MED ORDER — MIDAZOLAM HCL 2 MG/ML PO SYRP: 12.0000 mg | ORAL_SOLUTION | Freq: Once | ORAL | Status: DC | PRN

## 2012-12-10 MED ORDER — LACTATED RINGERS IV SOLN
INTRAVENOUS | Status: DC
  Administered 2012-12-10: 12:00:00 via INTRAVENOUS

## 2012-12-10 MED ORDER — FENTANYL CITRATE 0.05 MG/ML IJ SOLN: 50.0000 ug | INTRAMUSCULAR | Status: DC | PRN

## 2012-12-10 MED ORDER — CEFAZOLIN SODIUM-DEXTROSE 2-3 GM-% IV SOLR
2.0000 g | INTRAVENOUS | Status: AC
  Administered 2012-12-10: 2 g via INTRAVENOUS

## 2012-12-10 SURGICAL SUPPLY — 49 items
ADH SKN CLS APL DERMABOND .7 (GAUZE/BANDAGES/DRESSINGS) ×1
APPLICATOR COTTON TIP 6IN STRL (MISCELLANEOUS) IMPLANT
BINDER BREAST LRG (GAUZE/BANDAGES/DRESSINGS) IMPLANT
BINDER BREAST MEDIUM (GAUZE/BANDAGES/DRESSINGS) IMPLANT
BINDER BREAST XLRG (GAUZE/BANDAGES/DRESSINGS) ×1 IMPLANT
BINDER BREAST XXLRG (GAUZE/BANDAGES/DRESSINGS) IMPLANT
BLADE HEX COATED 2.75 (ELECTRODE) ×2 IMPLANT
BLADE SURG 15 STRL LF DISP TIS (BLADE) ×1 IMPLANT
BLADE SURG 15 STRL SS (BLADE) ×2
CANISTER SUCTION 1200CC (MISCELLANEOUS) ×2 IMPLANT
CHLORAPREP W/TINT 26ML (MISCELLANEOUS) ×2 IMPLANT
CLIP TI MEDIUM 6 (CLIP) IMPLANT
CLIP TI WIDE RED SMALL 6 (CLIP) IMPLANT
CLOTH BEACON ORANGE TIMEOUT ST (SAFETY) ×2 IMPLANT
COVER MAYO STAND STRL (DRAPES) ×2 IMPLANT
COVER TABLE BACK 60X90 (DRAPES) ×2 IMPLANT
DECANTER SPIKE VIAL GLASS SM (MISCELLANEOUS) IMPLANT
DERMABOND ADVANCED (GAUZE/BANDAGES/DRESSINGS) ×1
DERMABOND ADVANCED .7 DNX12 (GAUZE/BANDAGES/DRESSINGS) ×1 IMPLANT
DEVICE DUBIN W/COMP PLATE 8390 (MISCELLANEOUS) ×2 IMPLANT
DRAPE LAPAROTOMY TRNSV 102X78 (DRAPE) ×2 IMPLANT
DRAPE SURG 17X23 STRL (DRAPES) IMPLANT
DRAPE UTILITY XL STRL (DRAPES) ×2 IMPLANT
ELECT REM PT RETURN 9FT ADLT (ELECTROSURGICAL) ×2
ELECTRODE REM PT RTRN 9FT ADLT (ELECTROSURGICAL) ×1 IMPLANT
GLOVE BIO SURGEON STRL SZ7 (GLOVE) ×1 IMPLANT
GLOVE EUDERMIC 7 POWDERFREE (GLOVE) ×2 IMPLANT
GOWN PREVENTION PLUS XLARGE (GOWN DISPOSABLE) ×2 IMPLANT
KIT MARKER MARGIN INK (KITS) IMPLANT
NDL HYPO 25X1 1.5 SAFETY (NEEDLE) ×1 IMPLANT
NDL HYPO 30X.5 LL (NEEDLE) IMPLANT
NEEDLE HYPO 25X1 1.5 SAFETY (NEEDLE) ×2 IMPLANT
NEEDLE HYPO 30X.5 LL (NEEDLE) ×2 IMPLANT
NS IRRIG 1000ML POUR BTL (IV SOLUTION) ×2 IMPLANT
PACK BASIN DAY SURGERY FS (CUSTOM PROCEDURE TRAY) ×2 IMPLANT
PENCIL BUTTON HOLSTER BLD 10FT (ELECTRODE) ×2 IMPLANT
SHEET MEDIUM DRAPE 40X70 STRL (DRAPES) IMPLANT
SLEEVE SCD COMPRESS KNEE MED (MISCELLANEOUS) ×1 IMPLANT
SPONGE GAUZE 4X4 12PLY (GAUZE/BANDAGES/DRESSINGS) IMPLANT
SPONGE INTESTINAL PEANUT (DISPOSABLE) IMPLANT
SPONGE LAP 4X18 X RAY DECT (DISPOSABLE) ×2 IMPLANT
STAPLER VISISTAT 35W (STAPLE) IMPLANT
SUT MNCRL AB 4-0 PS2 18 (SUTURE) ×2 IMPLANT
SUT SILK 0 TIES 10X30 (SUTURE) IMPLANT
SUT VICRYL 3-0 CR8 SH (SUTURE) ×2 IMPLANT
SYR CONTROL 10ML LL (SYRINGE) ×2 IMPLANT
TOWEL OR NON WOVEN STRL DISP B (DISPOSABLE) ×2 IMPLANT
TUBE CONNECTING 20X1/4 (TUBING) ×2 IMPLANT
YANKAUER SUCT BULB TIP NO VENT (SUCTIONS) ×2 IMPLANT

## 2012-12-10 NOTE — Op Note (Signed)
[name]  04/09/1991  [id number]  12/10/2012   Preoperative diagnosis: fibroadenoma, left breast, lower outer quadrant  Postoperative diagnosis: the same  Procedure: wire localized excision of left breast mass, clinically fibroadenoma  Surgeon: Currie Paris, MD, FACS  Anesthesia: local, 1% Xylocaine with epinephrine, 0. 5% Marcaine plain  Clinical History and Indications: this patient presents for a guidewire localized excision of a Left breast mass thought to be a fibroadenoma.  Description of procedure: The patient was seen in the holding area and the plans for the procedure reviewed. The left breast was marked as the operative side. The wire localizing films were reviewed.the patient was noted to have a positive pregnancy test. At this point a guidewire was already in place. After discussion with anesthesia we elected to do the same procedure but under straight local anesthesia with no general or IV sedation because of the newly found pregnancy.  The patient was taken to the operating room and  the left breast was prepped and draped and the timeout was performed.  I gently infiltrated for local anesthesia over the area of the mass was localized with a guidewire. I waited a few minutes for the effect of the epinephrine in the local.  The incision was made over the presumed area of the mass. Skin flaps were raised and using cautery the area was completely excised. I thought I had a small margin of fatty tissue around most of it.Bleeders were controlled with either cautery or sutures as needed.  After achieving hemostasis, the incision was closed with 3-0 Vicryl, 4-0 Monocryl subcuticular, and Dermabond.  The specimen mammogram showed the mass to the and the specimen as was apparent from the physical exam of the specimen.  The patient tolerated the procedure well. There were no operative complications. All counts were correct.   EBL: minimal   Currie Paris,  MD, FACS 12/10/2012 1:57 PM

## 2012-12-10 NOTE — Transfer of Care (Signed)
Immediate Anesthesia Transfer of Care Note  Patient: [name]  Procedure(s) Performed: Procedure(s) with comments: BREAST BIOPSY WITH NEEDLE LOCALIZATION (Left) - local anethesia with monitored care  Patient Location: PACU  Anesthesia Type:MAC  Level of Consciousness: awake, alert  and oriented  Airway & Oxygen Therapy: Patient Spontanous Breathing  Post-op Assessment: Post -op Vital signs reviewed and stable  Post vital signs: Reviewed and stable  Complications: No apparent anesthesia complications

## 2012-12-10 NOTE — Anesthesia Preprocedure Evaluation (Addendum)
Anesthesia Evaluation  Patient identified by MRN, date of birth, ID band Patient awake    Reviewed: Allergy & Precautions, H&P , NPO status , Patient's Chart, lab work & pertinent test results  Airway Mallampati: II TM Distance: >3 FB Neck ROM: Full    Dental no notable dental hx. (+) Teeth Intact and Dental Advisory Given   Pulmonary neg pulmonary ROS,  breath sounds clear to auscultation  Pulmonary exam normal       Cardiovascular negative cardio ROS  Rhythm:Regular Rate:Normal     Neuro/Psych negative neurological ROS  negative psych ROS   GI/Hepatic negative GI ROS, Neg liver ROS,   Endo/Other  negative endocrine ROS  Renal/GU negative Renal ROS  negative genitourinary   Musculoskeletal   Abdominal   Peds  Hematology negative hematology ROS (+)   Anesthesia Other Findings   Reproductive/Obstetrics negative OB ROS                           Anesthesia Physical Anesthesia Plan  ASA: II  Anesthesia Plan: MAC   Post-op Pain Management:    Induction:   Airway Management Planned: Nasal Cannula  Additional Equipment:   Intra-op Plan:   Post-operative Plan:   Informed Consent: I have reviewed the patients History and Physical, chart, labs and discussed the procedure including the risks, benefits and alternatives for the proposed anesthesia with the patient or authorized representative who has indicated his/her understanding and acceptance.   Dental advisory given  Plan Discussed with: CRNA  Anesthesia Plan Comments: (Pt. Has a positive urine pregnancy test. She had a wire placed in her breast at the breast center  She wants to proceed with the case under local anesthesia. Risks and benefits were discussed with the patient and her mother. )       Anesthesia Quick Evaluation

## 2012-12-10 NOTE — H&P (View-Only) (Signed)
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 I discussed that with the patient and her mother. An alternative would be continued to follow the area since it has changed only very little since last year by ultrasound criteria. However because of the symptoms the patient is March did have this removed                 CC:  Chief Complaint  Patient presents with  . Breast Mass    Right    HPI:  [name] is a 22 y.o.  female who presents for evaluation of a left breast mass. This noticed about a year ago and a core biopsy was done showing a fibroadenoma. Over the year it has gotten somewhat larger and she is having some discomfort in it. She would like to have it removed surgically. She knows it is also likely benign based on the prior needle core biopsy. She has no other breast symptoms. She has a negative family history for breast cancer.  PMH:  has a past medical history of HPV (human papilloma virus) anogenital infection and Abnormal Pap smear.  PSH:   has past surgical history that includes Wisdom tooth extraction; Cesarean section (2012); and left breast bx.  ALLERGIES:  No Known Allergies  MEDICATIONS: No current outpatient prescriptions on file.  ROS: She has filled out our 12 point review of systems and it is negative . EXAM:   VITAL SIGNS:  BP 118/70  Pulse 72  Temp(Src) 98.2 F (36.8 C) (Oral)  Resp 16  Ht 5' 3.5" (1.613 m)  Wt 200 lb (90.719 kg)  BMI 34.87 kg/m2  GENERAL:  The patient is alert, oriented, and generally healthy-appearing, NAD. Mood and affect are normal.  HEENT:  The head is normocephalic, the eyes nonicteric, the  pupils were round regular and equal. EOMs are normal. Pharynx normal. Dentition good.  NECK:  The neck is supple and there are no masses or thyromegaly.  LUNGS: Normal respirations and clear to auscultation.  HEART: Regular rhythm, with no murmurs rubs or gallops. Pulses are intact carotid dorsalis pedis and posterior tibial. No significant varicosities are noted.  BREASTS:  the breasts are symmetric, somewhat irregular consistent with her age. With difficulty I can appreciate a 2 cm mass at about the 5:00 position of the left is mobile. There is a lot of irregular fibrocystic tissue around it so it is difficult to isolate completely.  ABDOMEN: Soft, flat, and nontender. No masses or organomegaly is noted. No hernias are noted. Bowel sounds are normal.  EXTREMITIES:  Good range of motion, no edema.   DATA REVIEWED:  I have reviewed the REPORTS AND THE PATHOLOGY REPORT FROM HER PRIOR BIOPSY    Samael Blades J 11/14/2012  CC: Holland, Richard M, MD,   HOLLAND,RICHARD M, MD        

## 2012-12-10 NOTE — Interval H&P Note (Signed)
History and Physical Interval Note:  12/10/2012 11:35 AM  [name]  has presented today for surgery, with the diagnosis of left breast mass  The various methods of treatment have been discussed with the patient and family. After consideration of risks, benefits and other options for treatment, the patient has consented to  Procedure(s): NEEDLE LOCALIZATION removal of left breast mass  (Left) as a surgical intervention .  The patient's history has been reviewed, patient examined, no change in status, stable for surgery.  I have reviewed the patient's chart and labs.  Questions were answered to the patient's satisfaction.   I marked the left breast  As the operative side and reviewed the fire loc ultrasound films  Jasai Sorg J

## 2012-12-10 NOTE — Anesthesia Postprocedure Evaluation (Signed)
  Anesthesia Post-op Note  Patient: [name]  Procedure(s) Performed: Procedure(s) with comments: BREAST BIOPSY WITH NEEDLE LOCALIZATION (Left) - local anethesia with monitored care  Patient Location: PACU  Anesthesia Type:MAC  Level of Consciousness: awake  Airway and Oxygen Therapy: Patient Spontanous Breathing  Post-op Pain: mild  Post-op Assessment: Post-op Vital signs reviewed, Patient's Cardiovascular Status Stable, Respiratory Function Stable, Patent Airway, No signs of Nausea or vomiting and Pain level controlled  Post-op Vital Signs: stable  Complications: No apparent anesthesia complications

## 2012-12-11 ENCOUNTER — Encounter (HOSPITAL_BASED_OUTPATIENT_CLINIC_OR_DEPARTMENT_OTHER): Payer: Self-pay | Admitting: Surgery

## 2012-12-15 ENCOUNTER — Telehealth (INDEPENDENT_AMBULATORY_CARE_PROVIDER_SITE_OTHER): Payer: Self-pay | Admitting: General Surgery

## 2012-12-15 NOTE — Telephone Encounter (Signed)
Message copied by Liliana Cline on Mon Dec 15, 2012  8:52 AM ------      Message from: Currie Paris      Created: Mon Dec 15, 2012  7:17 AM       Tell her path is benign and as expected ------

## 2012-12-15 NOTE — Telephone Encounter (Addendum)
Left message on machine for patient to call back and ask for me. Spoke to patient, given pathology results (Benign) to patient

## 2012-12-19 ENCOUNTER — Encounter (INDEPENDENT_AMBULATORY_CARE_PROVIDER_SITE_OTHER): Payer: Self-pay | Admitting: Surgery

## 2012-12-19 ENCOUNTER — Ambulatory Visit (INDEPENDENT_AMBULATORY_CARE_PROVIDER_SITE_OTHER): Payer: Commercial Managed Care - PPO | Admitting: Surgery

## 2012-12-19 VITALS — BP 126/72 | HR 66 | Temp 98.0°F | Resp 18 | Ht 63.0 in | Wt 200.0 lb

## 2012-12-19 DIAGNOSIS — N63 Unspecified lump in unspecified breast: Secondary | ICD-10-CM

## 2012-12-19 DIAGNOSIS — N6310 Unspecified lump in the right breast, unspecified quadrant: Secondary | ICD-10-CM

## 2012-12-19 NOTE — Progress Notes (Signed)
NAME: [name]                                            DOB: 07-Jun-1991 DATE: 12/19/2012                                                  MRN: [id number]  CC:  Chief Complaint  Patient presents with  . Routine Post Op    br mass    HPI: This patient comes in for post op follow-up .Sheunderwent removal of a left breast mass on 12/10/12. She feels that she is doing well.  PE:  VITAL SIGNS: BP 126/72  Pulse 66  Temp(Src) 98 F (36.7 C)  Resp 18  Ht 5\' 3"  (1.6 m)  Wt 200 lb (90.719 kg)  BMI 35.44 kg/m2  LMP 11/09/2012  General: The patient appears to be healthy, NAD Breast: Incision healing nicely, no infection or problems  DATA REVIEWED: Path: Diagnosis Breast, lumpectomy, Left - FIBROADENOMA SHOWING EMBEDDED FIBROCYSTIC CHANGES WITH USUAL DUCTAL HYPERPLASIA. - NO ATYPIA OR MALIGNANCY IDENTIFIED. Zandra Abts MD Pathologist, Electronic Signature (Case signed 12/12/2012)  IMPRESSION: The patient is doing well S/P removal of a left breast fibroadenoma    PLAN: RTC PRN. Gave her a copy of path report and discussed with her.

## 2012-12-19 NOTE — Patient Instructions (Signed)
We will see you again on an as needed basis. Please call the office at [phone] if you have any questions or concerns. Thank you for allowing us to take care of you.  

## 2013-01-06 LAB — OB RESULTS CONSOLE RPR: RPR: NONREACTIVE

## 2013-01-06 LAB — OB RESULTS CONSOLE ANTIBODY SCREEN: Antibody Screen: NEGATIVE

## 2013-01-06 LAB — OB RESULTS CONSOLE HEPATITIS B SURFACE ANTIGEN: HEP B S AG: NEGATIVE

## 2013-01-06 LAB — OB RESULTS CONSOLE ABO/RH: RH TYPE: POSITIVE

## 2013-01-06 LAB — OB RESULTS CONSOLE HIV ANTIBODY (ROUTINE TESTING): HIV: NONREACTIVE

## 2013-01-06 LAB — OB RESULTS CONSOLE RUBELLA ANTIBODY, IGM: Rubella: IMMUNE

## 2013-04-27 ENCOUNTER — Inpatient Hospital Stay (HOSPITAL_COMMUNITY)
Admission: AD | Admit: 2013-04-27 | Discharge: 2013-04-27 | Disposition: A | Discharge status: Home / Self Care | Payer: Commercial Managed Care - PPO | Source: Ambulatory Visit | Attending: Obstetrics & Gynecology | Admitting: Obstetrics & Gynecology

## 2013-04-27 ENCOUNTER — Inpatient Hospital Stay (HOSPITAL_COMMUNITY): Payer: Commercial Managed Care - PPO

## 2013-04-27 ENCOUNTER — Encounter (HOSPITAL_COMMUNITY): Payer: Self-pay | Admitting: *Deleted

## 2013-04-27 DIAGNOSIS — W108XXA Fall (on) (from) other stairs and steps, initial encounter: Secondary | ICD-10-CM | POA: Insufficient documentation

## 2013-04-27 DIAGNOSIS — O99891 Other specified diseases and conditions complicating pregnancy: Secondary | ICD-10-CM | POA: Insufficient documentation

## 2013-04-27 DIAGNOSIS — W010XXA Fall on same level from slipping, tripping and stumbling without subsequent striking against object, initial encounter: Secondary | ICD-10-CM

## 2013-04-27 DIAGNOSIS — W19XXXA Unspecified fall, initial encounter: Secondary | ICD-10-CM

## 2013-04-27 DIAGNOSIS — M25571 Pain in right ankle and joints of right foot: Secondary | ICD-10-CM

## 2013-04-27 DIAGNOSIS — S93409A Sprain of unspecified ligament of unspecified ankle, initial encounter: Secondary | ICD-10-CM

## 2013-04-27 DIAGNOSIS — Z3490 Encounter for supervision of normal pregnancy, unspecified, unspecified trimester: Secondary | ICD-10-CM

## 2013-04-27 DIAGNOSIS — Y92009 Unspecified place in unspecified non-institutional (private) residence as the place of occurrence of the external cause: Secondary | ICD-10-CM

## 2013-04-27 DIAGNOSIS — M25579 Pain in unspecified ankle and joints of unspecified foot: Secondary | ICD-10-CM | POA: Insufficient documentation

## 2013-04-27 LAB — URINALYSIS, ROUTINE W REFLEX MICROSCOPIC
Bilirubin Urine: NEGATIVE
Glucose, UA: NEGATIVE mg/dL
Hgb urine dipstick: NEGATIVE
Specific Gravity, Urine: 1.025 (ref 1.005–1.030)
pH: 6 (ref 5.0–8.0)

## 2013-04-27 MED ORDER — OXYCODONE-ACETAMINOPHEN 5-325 MG PO TABS: 2.0000 | ORAL_TABLET | ORAL | Status: DC | PRN

## 2013-04-27 MED ORDER — PROMETHAZINE HCL 25 MG PO TABS
25.0000 mg | ORAL_TABLET | Freq: Once | ORAL | Status: AC
  Administered 2013-04-27: 25 mg via ORAL
  Filled 2013-04-27: qty 1

## 2013-04-27 MED ORDER — OXYCODONE-ACETAMINOPHEN 5-325 MG PO TABS
1.0000 | ORAL_TABLET | Freq: Once | ORAL | Status: AC
  Administered 2013-04-27: 1 via ORAL
  Filled 2013-04-27: qty 1

## 2013-04-27 NOTE — MAU Note (Signed)
Walking down the steps, missed the last one, ankles are bothering her, hit left hip and side of abd.

## 2013-04-27 NOTE — MAU Provider Note (Signed)
History     CSN: [id number]  Arrival date and time: 04/27/13 4098   First Provider Initiated Contact with Patient 04/27/13 0911      Chief Complaint  Patient presents with  . Fall   HPI Comments: [name] 22 y.o. [id number] presents to MAU today after missing the last step on stairs while holding her daughter. She is unsure if she hit her abdomen. She did however twist both ankles which now are both quite painful. She is able to walk but the pain when she walks is about 7/10. She has a history of placental abruption and denies any bleeding now. She says she has Braxton-hicks all the time.     Patient is a 22 y.o. female presenting with fall.  Fall      Past Medical History  Diagnosis Date  . HPV (human papilloma virus) anogenital infection   . Mass of breast, left 11/2012  . Complication of anesthesia     loss of sensation in part of tongue after wisdom teeth extraction; loss of sensation at c-section incision  . Family history of anesthesia complication     father had loss of sensation at surgical site    Past Surgical History  Procedure Laterality Date  . Wisdom tooth extraction    . Cesarean section  09/01/2010  . Laser ablation condolamata  02/08/2012    CO2 laser vaporization vulvar condyloma  . Breast biopsy Left 12/10/2012    Procedure: BREAST BIOPSY WITH NEEDLE LOCALIZATION;  Surgeon: Currie Paris, MD;  Location: [facility];  Service: General;  Laterality: Left;  local anethesia with monitored care    Family History  Problem Relation Age of Onset  . Diabetes Maternal Grandfather   . Hyperlipidemia Father   . Thyroid disease Father     History  Substance Use Topics  . Smoking status: Never Smoker   . Smokeless tobacco: Never Used  . Alcohol Use: Yes     Comment: occasionally    Allergies: No Known Allergies  Prescriptions prior to admission  Medication Sig Dispense Refill  . Prenatal Vit-Fe Fumarate-FA (PRENATAL  MULTIVITAMIN) TABS tablet Take 1 tablet by mouth daily at 12 noon.        Review of Systems  Constitutional: Negative.   HENT: Negative.   Eyes: Negative.   Respiratory: Negative.   Cardiovascular: Negative.   Gastrointestinal: Negative.   Genitourinary: Negative.   Musculoskeletal: Positive for falls.       Both ankles hurt/ can walk  Skin:       Scratches left inner thigh   Physical Exam   Blood pressure 111/72, pulse 88, temperature 98 F (36.7 C), temperature source Oral, resp. rate 18, last menstrual period 11/09/2012.  Physical Exam  Constitutional: She is oriented to person, place, and time. She appears well-developed and well-nourished. No distress.  HENT:  Head: Normocephalic and atraumatic.  Eyes: Pupils are equal, round, and reactive to light.  Cardiovascular: Normal rate, regular rhythm and normal heart sounds.   Respiratory: Effort normal and breath sounds normal.  GI: Soft. Bowel sounds are normal. There is no tenderness.  Genitourinary:  Not examined  Musculoskeletal: She exhibits tenderness. She exhibits no edema.  Bilateral ankles no bruising/ swelling at this time. Tender at ankles bilat  Neurological: She is alert and oriented to person, place, and time.  Skin: Skin is warm and dry.  Psychiatric: She has a normal mood and affect. Her behavior is normal. Judgment normal.   Dg  Ankle Complete Left  04/27/2013   CLINICAL DATA:  Larey Seat down stairs this morning, anterior pain at distal left ankle, [redacted] weeks pregnant  EXAM: LEFT ANKLE COMPLETE - 3+ VIEW  COMPARISON:  None  FINDINGS: Osseous mineralization normal.  Joint spaces preserved.  No fracture, dislocation, or bone destruction.  IMPRESSION: Normal exam.   Electronically Signed   By: Ulyses Southward M.D.   On: 04/27/2013 10:36   Dg Ankle Complete Right  04/27/2013   CLINICAL DATA:  Larey Seat down stairs this morning, pain greatest laterally, [redacted] weeks pregnant  EXAM: RIGHT ANKLE - COMPLETE 3+ VIEW  COMPARISON:  None   FINDINGS: Osseous mineralization normal.  Joint spaces preserved.  No fracture, dislocation, or bone destruction.  IMPRESSION: Normal exam.   Electronically Signed   By: Ulyses Southward M.D.   On: 04/27/2013 10:35     MAU Course  Procedures  MDM  Bilateral ankle Xray Spoke with Dr Langston Masker who agreed with plan  Assessment and Plan   A. Fall in pregnancy Bilateral ankle pain  P: Note to be out of work till Wednesday Percocet # 10/ no refills Ice, elevate, wrap as needed Follow up with Dr Marjean Donna, [name] 04/27/2013, 9:21 AM

## 2013-07-15 LAB — OB RESULTS CONSOLE GBS: GBS: NEGATIVE

## 2013-07-22 ENCOUNTER — Encounter (HOSPITAL_COMMUNITY): Payer: Self-pay | Admitting: Pharmacist

## 2013-07-27 NOTE — H&P (Signed)
[name]  DICTATION # [id number] CSN# [id number]   Margarette Asal, MD 07/27/2013 3:59 PM

## 2013-07-28 NOTE — H&P (Signed)
NAMEADRIA, COSTLEY               ACCOUNT NO.:  [id number]  MEDICAL RECORD NO.:  [id number]  LOCATION:                                 FACILITY:  PHYSICIAN:  Ralene Bathe. Matthew Saras, M.D.DATE OF BIRTH:  06/14/1991  DATE OF ADMISSION:  08/04/2013 DATE OF DISCHARGE:                             HISTORY & PHYSICAL   CHIEF COMPLAINT:  For repeat cesarean section and tubal ligation at term.  HPI:  A 23 year old, G3, P-0-1-1, EDD on August 11, 2013, presents at term for repeat cesarean section and tubal ligation.  She is sure she would not want to be pregnant again under any circumstances.  This procedure including specific risks related to bleeding, infection, transfusion, adjacent organ injury, the permanence of the tubal procedure, failure rated 2 to 09/998, along with her expected recovery time discussed with her which she understands and accepts.  Blood type is O positive.  Please see her [facility] form for past medical history.  PHYSICAL EXAMINATION:  VITAL SIGNS:  Temp 98.2, blood pressure 120/78. HEENT:  Unremarkable. NECK:  Supple without masses. LUNGS:  Clear. CARDIOVASCULAR:  Regular rate and rhythm without murmurs, rubs, or gallops. BREASTS:  Without masses. PELVIC: Term fundal height.  Fetal heart rate 140.  Cervix was closed. EXTREMITIES:  Unremarkable. NEUROLOGIC:  Unremarkable.  IMPRESSION:  Term pregnancy, for repeat cesarean section and tubal ligation, prefers bilateral salpingectomy to potentially reduce later life ovarian cancer risk.  PLAN:  Procedure and risks discussed as above.     Jeronimo Hellberg M. Matthew Saras, M.D.     [facility]MED[facility]  D:  07/27/2013  T:  07/28/2013  Job:  [id number]

## 2013-07-30 ENCOUNTER — Encounter (HOSPITAL_COMMUNITY): Payer: Self-pay | Admitting: *Deleted

## 2013-07-30 ENCOUNTER — Inpatient Hospital Stay (HOSPITAL_COMMUNITY)
Admission: AD | Admit: 2013-07-30 | Discharge: 2013-07-30 | Disposition: A | Discharge status: Home / Self Care | Payer: Commercial Managed Care - PPO | Source: Ambulatory Visit | Attending: Obstetrics & Gynecology | Admitting: Obstetrics & Gynecology

## 2013-07-30 DIAGNOSIS — O368190 Decreased fetal movements, unspecified trimester, not applicable or unspecified: Secondary | ICD-10-CM | POA: Diagnosis present

## 2013-07-30 DIAGNOSIS — O368130 Decreased fetal movements, third trimester, not applicable or unspecified: Secondary | ICD-10-CM

## 2013-07-30 DIAGNOSIS — R109 Unspecified abdominal pain: Secondary | ICD-10-CM | POA: Insufficient documentation

## 2013-07-30 DIAGNOSIS — O479 False labor, unspecified: Secondary | ICD-10-CM | POA: Insufficient documentation

## 2013-07-30 NOTE — Discharge Instructions (Signed)
Fetal Movement Counts Patient Name: __________________________________________________ Patient Due Date: ____________________ Performing a fetal movement count is highly recommended in high-risk pregnancies, but it is good for every pregnant woman to do. Your caregiver may ask you to start counting fetal movements at 28 weeks of the pregnancy. Fetal movements often increase:  After eating a full meal.  After physical activity.  After eating or drinking something sweet or cold.  At rest. Pay attention to when you feel the baby is most active. This will help you notice a pattern of your baby's sleep and wake cycles and what factors contribute to an increase in fetal movement. It is important to perform a fetal movement count at the same time each day when your baby is normally most active.  HOW TO COUNT FETAL MOVEMENTS 1. Find a quiet and comfortable area to sit or lie down on your left side. Lying on your left side provides the best blood and oxygen circulation to your baby. 2. Write down the day and time on a sheet of paper or in a journal. 3. Start counting kicks, flutters, swishes, rolls, or jabs in a 2 hour period. You should feel at least 10 movements within 2 hours. 4. If you do not feel 10 movements in 2 hours, wait 2 3 hours and count again. Look for a change in the pattern or not enough counts in 2 hours. SEEK MEDICAL CARE IF:  You feel less than 10 counts in 2 hours, tried twice.  There is no movement in over an hour.  The pattern is changing or taking longer each day to reach 10 counts in 2 hours.  You feel the baby is not moving as he or she usually does. Date: ____________ Movements: ____________ Start time: ____________ Finish time: ____________  Date: ____________ Movements: ____________ Start time: ____________ Finish time: ____________ Date: ____________ Movements: ____________ Start time: ____________ Finish time: ____________ Date: ____________ Movements: ____________  Start time: ____________ Finish time: ____________ Date: ____________ Movements: ____________ Start time: ____________ Finish time: ____________ Date: ____________ Movements: ____________ Start time: ____________ Finish time: ____________ Date: ____________ Movements: ____________ Start time: ____________ Finish time: ____________ Date: ____________ Movements: ____________ Start time: ____________ Finish time: ____________  Date: ____________ Movements: ____________ Start time: ____________ Finish time: ____________ Date: ____________ Movements: ____________ Start time: ____________ Finish time: ____________ Date: ____________ Movements: ____________ Start time: ____________ Finish time: ____________ Date: ____________ Movements: ____________ Start time: ____________ Finish time: ____________ Date: ____________ Movements: ____________ Start time: ____________ Finish time: ____________ Date: ____________ Movements: ____________ Start time: ____________ Finish time: ____________ Date: ____________ Movements: ____________ Start time: ____________ Finish time: ____________  Date: ____________ Movements: ____________ Start time: ____________ Finish time: ____________ Date: ____________ Movements: ____________ Start time: ____________ Finish time: ____________ Date: ____________ Movements: ____________ Start time: ____________ Finish time: ____________ Date: ____________ Movements: ____________ Start time: ____________ Finish time: ____________ Date: ____________ Movements: ____________ Start time: ____________ Finish time: ____________ Date: ____________ Movements: ____________ Start time: ____________ Finish time: ____________ Date: ____________ Movements: ____________ Start time: ____________ Finish time: ____________  Date: ____________ Movements: ____________ Start time: ____________ Finish time: ____________ Date: ____________ Movements: ____________ Start time: ____________ Finish time:  ____________ Date: ____________ Movements: ____________ Start time: ____________ Finish time: ____________ Date: ____________ Movements: ____________ Start time: ____________ Finish time: ____________ Date: ____________ Movements: ____________ Start time: ____________ Finish time: ____________ Date: ____________ Movements: ____________ Start time: ____________ Finish time: ____________ Date: ____________ Movements: ____________ Start time: ____________ Finish time: ____________  Date: ____________ Movements: ____________ Start time: ____________ Finish   time: ____________ Date: ____________ Movements: ____________ Start time: ____________ Finish time: ____________ Date: ____________ Movements: ____________ Start time: ____________ Finish time: ____________ Date: ____________ Movements: ____________ Start time: ____________ Finish time: ____________ Date: ____________ Movements: ____________ Start time: ____________ Finish time: ____________ Date: ____________ Movements: ____________ Start time: ____________ Finish time: ____________ Date: ____________ Movements: ____________ Start time: ____________ Finish time: ____________  Date: ____________ Movements: ____________ Start time: ____________ Finish time: ____________ Date: ____________ Movements: ____________ Start time: ____________ Finish time: ____________ Date: ____________ Movements: ____________ Start time: ____________ Finish time: ____________ Date: ____________ Movements: ____________ Start time: ____________ Finish time: ____________ Date: ____________ Movements: ____________ Start time: ____________ Finish time: ____________ Date: ____________ Movements: ____________ Start time: ____________ Finish time: ____________ Date: ____________ Movements: ____________ Start time: ____________ Finish time: ____________  Date: ____________ Movements: ____________ Start time: ____________ Finish time: ____________ Date: ____________ Movements:  ____________ Start time: ____________ Finish time: ____________ Date: ____________ Movements: ____________ Start time: ____________ Finish time: ____________ Date: ____________ Movements: ____________ Start time: ____________ Finish time: ____________ Date: ____________ Movements: ____________ Start time: ____________ Finish time: ____________ Date: ____________ Movements: ____________ Start time: ____________ Finish time: ____________ Date: ____________ Movements: ____________ Start time: ____________ Finish time: ____________  Date: ____________ Movements: ____________ Start time: ____________ Finish time: ____________ Date: ____________ Movements: ____________ Start time: ____________ Finish time: ____________ Date: ____________ Movements: ____________ Start time: ____________ Finish time: ____________ Date: ____________ Movements: ____________ Start time: ____________ Finish time: ____________ Date: ____________ Movements: ____________ Start time: ____________ Finish time: ____________ Date: ____________ Movements: ____________ Start time: ____________ Finish time: ____________ Document Released: 08/01/2006 Document Revised: 06/18/2012 Document Reviewed: 04/28/2012 [employer] Patient Information 2014 [employer].  

## 2013-07-30 NOTE — MAU Note (Signed)
Baby wasn't moving much today, though is moving more now- is still not moving as much.  BP was a little high, and has been having pains- thought she should get checked.

## 2013-07-30 NOTE — MAU Note (Addendum)
Pt states she has not been feeling the baby move as much as 'he' had been before "now since i have been here he has moved more, he has moved about 3 times since i have been here" pt states she is feeling pain in her upper abdomen and some in her lower abdomen. "but it is different from the contractions"

## 2013-07-30 NOTE — MAU Provider Note (Signed)
Chief Complaint:  Decreased Fetal Movement and Abdominal Pain   First Provider Initiated Contact with Patient 07/30/13 1913      HPI: [name] is a 23 y.o. G3P1011 at [redacted]w[redacted]d who presents to maternity admissions reporting decreased FM today, estimating about 5 movements perceived over 5 hrs PTA. Since in MAU on EFM she feels normal fetal activity. Also having some upper abdominal pains and vaginal pain, not severe and no pain at present. Cx closed at office visit yesterday. Denies contractions, leakage of fluid or vaginal bleeding. Good fetal movement.   Pregnancy Course: Previous C/S>scheduled repeat in 5 days; essentially uncomplicated course  Past Medical History: Past Medical History  Diagnosis Date  . HPV (human papilloma virus) anogenital infection   . Mass of breast, left 11/2012  . Complication of anesthesia     loss of sensation in part of tongue after wisdom teeth extraction; loss of sensation at c-section incision  . Family history of anesthesia complication     father had loss of sensation at surgical site    Past obstetric history: OB History  Gravida Para Term Preterm AB SAB TAB Ectopic Multiple Living  3 1 1  1 1    1     # Outcome Date GA Lbr Len/2nd Weight Sex Delivery Anes PTL Lv  3 CUR           2 SAB           1 TRM               Past Surgical History: Past Surgical History  Procedure Laterality Date  . Wisdom tooth extraction    . Cesarean section  09/01/2010  . Laser ablation condolamata  02/08/2012    CO2 laser vaporization vulvar condyloma  . Breast biopsy Left 12/10/2012    Procedure: BREAST BIOPSY WITH NEEDLE LOCALIZATION;  Surgeon: Haywood Lasso, MD;  Location: [facility];  Service: General;  Laterality: Left;  local anethesia with monitored care     Family History: Family History  Problem Relation Age of Onset  . Diabetes Maternal Grandfather   . Hyperlipidemia Father   . Thyroid disease Father     Social  History: History  Substance Use Topics  . Smoking status: Never Smoker   . Smokeless tobacco: Never Used  . Alcohol Use: Yes     Comment: occasionally    Allergies: No Known Allergies  Meds:  Prescriptions prior to admission  Medication Sig Dispense Refill  . Prenat w/o A-[id number]-[id number]-DHA (CITRANATAL 90 DHA) 90-1 & 300 MG MISC Take 1 capsule by mouth daily.      Marland Kitchen acetaminophen (TYLENOL) 500 MG tablet Take 1,000 mg by mouth every 6 (six) hours as needed for mild pain.         ROS: Pertinent findings in history of present illness.  Physical Exam  Blood pressure 123/69, pulse 94, temperature 98.2 F (36.8 C), temperature source Oral, resp. rate 18, height 5\' 4"  (1.626 m), weight 101.152 kg (223 lb), last menstrual period 11/09/2012. GENERAL: Well-developed, well-nourished female in no acute distress.  HEENT: normocephalic HEART: normal rate RESP: normal effort ABDOMEN: Soft, minimally tender suprapubic and groin areas, gravid appropriate for gestational age, EFW 7# EXTREMITIES: Nontender, no edema NEURO: alert and oriented  FHT:  Baseline 135-140 , moderate variability, accelerations present, no decelerations Contractions: irregular, mild   Labs: No results found for this or any previous visit (from the past 24 hour(s)).  Imaging:  No results found.  MAU Course: EFM  Assessment: 1. Decreased fetal movements in third trimester   G3P1011 at [redacted]w[redacted]d Category 1 FHR  Plan: C/W Dr. Lynnette Caffey Discharge home Labor precautions and fetal kick counts    Medication List         acetaminophen 500 MG tablet  Commonly known as:  TYLENOL  Take 1,000 mg by mouth every 6 (six) hours as needed for mild pain.     CITRANATAL 90 DHA 90-1 & 300 MG Misc  Take 1 capsule by mouth daily.       Follow-up Information   Follow up On 08/04/2013.      Lorene Dy, CNM 07/30/2013 7:15 PM

## 2013-08-03 ENCOUNTER — Encounter (HOSPITAL_COMMUNITY): Payer: Self-pay

## 2013-08-03 ENCOUNTER — Encounter (HOSPITAL_COMMUNITY)
Admission: RE | Admit: 2013-08-03 | Discharge: 2013-08-03 | Disposition: A | Discharge status: Home / Self Care | Payer: Commercial Managed Care - PPO | Source: Ambulatory Visit | Attending: Obstetrics and Gynecology | Admitting: Obstetrics and Gynecology

## 2013-08-03 HISTORY — DX: Gastro-esophageal reflux disease without esophagitis: K21.9

## 2013-08-03 HISTORY — DX: Diseases of the digestive system complicating pregnancy, unspecified trimester: O99.619

## 2013-08-03 LAB — CBC
HCT: 33.9 % — ABNORMAL LOW (ref 36.0–46.0)
Hemoglobin: 10.9 g/dL — ABNORMAL LOW (ref 12.0–15.0)
MCH: 25.8 pg — ABNORMAL LOW (ref 26.0–34.0)
MCHC: 32.2 g/dL (ref 30.0–36.0)
MCV: 80.1 fL (ref 78.0–100.0)
PLATELETS: 253 10*3/uL (ref 150–400)
RBC: 4.23 MIL/uL (ref 3.87–5.11)
RDW: 15.8 % — AB (ref 11.5–15.5)
WBC: 10.7 10*3/uL — AB (ref 4.0–10.5)

## 2013-08-03 LAB — RPR: RPR: NONREACTIVE

## 2013-08-03 MED ORDER — DEXTROSE 5 % IV SOLN
2.0000 g | INTRAVENOUS | Status: DC
  Filled 2013-08-03: qty 2

## 2013-08-03 NOTE — Patient Instructions (Addendum)
   Your procedure is scheduled on:  Tuesday, Jan 20  Enter through the [employer] [facility] at: [place] up the phone at the desk and dial [phone] and inform us of your arrival.  Please call this number if you have any problems the morning of surgery: [phone]  Remember: Do not eat or drink after midnight:  Monday Take these medicines the morning of surgery with a SIP OF WATER: None  Do not wear jewelry, make-up, or FINGER nail polish No metal in your hair or on your body.   Do not wear lotions, powders, perfumes.  You may wear deodorant.   Do not bring valuables to the hospital. Contacts, dentures or bridgework may not be worn into surgery.  Leave suitcase in the car. After Surgery it may be brought to your room. For patients being admitted to the hospital, checkout time is 11:00am the day of discharge.  Home with [place] cell [phone].

## 2013-08-04 ENCOUNTER — Inpatient Hospital Stay (HOSPITAL_COMMUNITY)
Admission: RE | Admit: 2013-08-04 | Discharge: 2013-08-07 | DRG: 766 | Disposition: A | Discharge status: Home / Self Care | Payer: Commercial Managed Care - PPO | Source: Ambulatory Visit | Attending: Obstetrics and Gynecology | Admitting: Obstetrics and Gynecology

## 2013-08-04 ENCOUNTER — Inpatient Hospital Stay (HOSPITAL_COMMUNITY): Payer: Commercial Managed Care - PPO | Admitting: Anesthesiology

## 2013-08-04 ENCOUNTER — Encounter (HOSPITAL_COMMUNITY): Payer: Self-pay

## 2013-08-04 ENCOUNTER — Encounter (HOSPITAL_COMMUNITY): Payer: Commercial Managed Care - PPO | Admitting: Anesthesiology

## 2013-08-04 ENCOUNTER — Encounter (HOSPITAL_COMMUNITY)
Admission: RE | Disposition: A | Discharge status: Home / Self Care | Payer: Self-pay | Source: Ambulatory Visit | Attending: Obstetrics and Gynecology

## 2013-08-04 DIAGNOSIS — Z302 Encounter for sterilization: Secondary | ICD-10-CM | POA: Diagnosis not present

## 2013-08-04 DIAGNOSIS — O34219 Maternal care for unspecified type scar from previous cesarean delivery: Secondary | ICD-10-CM | POA: Diagnosis present

## 2013-08-04 LAB — PREPARE RBC (CROSSMATCH)

## 2013-08-04 SURGERY — Surgical Case
Anesthesia: Spinal | Site: Abdomen | Laterality: Bilateral

## 2013-08-04 MED ORDER — MEPERIDINE HCL 25 MG/ML IJ SOLN: 6.2500 mg | INTRAMUSCULAR | Status: DC | PRN

## 2013-08-04 MED ORDER — SCOPOLAMINE 1 MG/3DAYS TD PT72
MEDICATED_PATCH | TRANSDERMAL | Status: AC
  Administered 2013-08-04: 1.5 mg
  Filled 2013-08-04: qty 1

## 2013-08-04 MED ORDER — DIPHENHYDRAMINE HCL 50 MG/ML IJ SOLN
12.5000 mg | INTRAMUSCULAR | Status: DC | PRN
Start: 2013-08-04 — End: 2013-08-07

## 2013-08-04 MED ORDER — FLEET ENEMA 7-19 GM/118ML RE ENEM: 1.0000 | ENEMA | Freq: Every day | RECTAL | Status: DC | PRN

## 2013-08-04 MED ORDER — SODIUM CHLORIDE 0.9 % IJ SOLN: 3.0000 mL | INTRAMUSCULAR | Status: DC | PRN

## 2013-08-04 MED ORDER — OXYCODONE-ACETAMINOPHEN 5-325 MG PO TABS
1.0000 | ORAL_TABLET | Freq: Four times a day (QID) | ORAL | Status: DC | PRN
  Administered 2013-08-05 (×2): 2 via ORAL
  Administered 2013-08-05 – 2013-08-06 (×4): 1 via ORAL
  Administered 2013-08-06 – 2013-08-07 (×3): 2 via ORAL
  Filled 2013-08-04 (×3): qty 1
  Filled 2013-08-04: qty 2
  Filled 2013-08-04: qty 1
  Filled 2013-08-04 (×4): qty 2

## 2013-08-04 MED ORDER — NALOXONE HCL 0.4 MG/ML IJ SOLN: 0.4000 mg | INTRAMUSCULAR | Status: DC | PRN

## 2013-08-04 MED ORDER — DIPHENHYDRAMINE HCL 50 MG/ML IJ SOLN: 25.0000 mg | INTRAMUSCULAR | Status: DC | PRN

## 2013-08-04 MED ORDER — LACTATED RINGERS IV SOLN
INTRAVENOUS | Status: DC | PRN
  Administered 2013-08-04 (×2): via INTRAVENOUS

## 2013-08-04 MED ORDER — LACTATED RINGERS IV SOLN
INTRAVENOUS | Status: DC
  Administered 2013-08-04: 07:00:00 via INTRAVENOUS

## 2013-08-04 MED ORDER — DIPHENHYDRAMINE HCL 25 MG PO CAPS: 25.0000 mg | ORAL_CAPSULE | Freq: Four times a day (QID) | ORAL | Status: DC | PRN

## 2013-08-04 MED ORDER — MORPHINE SULFATE 10 MG/ML IJ SOLN: INTRAMUSCULAR | Status: DC | PRN

## 2013-08-04 MED ORDER — CEFAZOLIN SODIUM-DEXTROSE 2-3 GM-% IV SOLR
INTRAVENOUS | Status: DC | PRN
  Administered 2013-08-04: 2 g via INTRAVENOUS

## 2013-08-04 MED ORDER — PHENYLEPHRINE 8 MG IN D5W 100 ML (0.08MG/ML) PREMIX OPTIME
INJECTION | INTRAVENOUS | Status: DC | PRN
  Administered 2013-08-04: 60 ug/min via INTRAVENOUS

## 2013-08-04 MED ORDER — BISACODYL 10 MG RE SUPP: 10.0000 mg | Freq: Every day | RECTAL | Status: DC | PRN

## 2013-08-04 MED ORDER — DIBUCAINE 1 % RE OINT: 1.0000 "application " | TOPICAL_OINTMENT | RECTAL | Status: DC | PRN

## 2013-08-04 MED ORDER — DIPHENHYDRAMINE HCL 25 MG PO CAPS: 25.0000 mg | ORAL_CAPSULE | ORAL | Status: DC | PRN

## 2013-08-04 MED ORDER — IBUPROFEN 800 MG PO TABS
800.0000 mg | ORAL_TABLET | Freq: Three times a day (TID) | ORAL | Status: DC | PRN
  Administered 2013-08-05 – 2013-08-07 (×6): 800 mg via ORAL
  Filled 2013-08-04 (×7): qty 1

## 2013-08-04 MED ORDER — SODIUM CHLORIDE 0.9 % IV SOLN: 250.0000 mL | INTRAVENOUS | Status: DC

## 2013-08-04 MED ORDER — KETOROLAC TROMETHAMINE 60 MG/2ML IM SOLN
60.0000 mg | Freq: Once | INTRAMUSCULAR | Status: AC | PRN
  Administered 2013-08-04: 60 mg via INTRAMUSCULAR

## 2013-08-04 MED ORDER — NALBUPHINE HCL 10 MG/ML IJ SOLN
5.0000 mg | INTRAMUSCULAR | Status: DC | PRN
  Administered 2013-08-04: 5 mg via SUBCUTANEOUS

## 2013-08-04 MED ORDER — NALBUPHINE HCL 10 MG/ML IJ SOLN: 5.0000 mg | INTRAMUSCULAR | Status: DC | PRN

## 2013-08-04 MED ORDER — OXYTOCIN 40 UNITS IN LACTATED RINGERS INFUSION - SIMPLE MED: 62.5000 mL/h | INTRAVENOUS | Status: AC

## 2013-08-04 MED ORDER — ONDANSETRON HCL 4 MG/2ML IJ SOLN: 4.0000 mg | INTRAMUSCULAR | Status: DC | PRN

## 2013-08-04 MED ORDER — FENTANYL CITRATE 0.05 MG/ML IJ SOLN
INTRAMUSCULAR | Status: AC
  Filled 2013-08-04: qty 2

## 2013-08-04 MED ORDER — KETOROLAC TROMETHAMINE 30 MG/ML IJ SOLN: 30.0000 mg | Freq: Four times a day (QID) | INTRAMUSCULAR | Status: AC | PRN

## 2013-08-04 MED ORDER — MORPHINE SULFATE (PF) 0.5 MG/ML IJ SOLN
INTRAMUSCULAR | Status: DC | PRN
Start: 2013-08-04 — End: 2013-08-04
  Administered 2013-08-04: .15 mg via INTRATHECAL

## 2013-08-04 MED ORDER — PRENATAL MULTIVITAMIN CH
1.0000 | ORAL_TABLET | Freq: Every day | ORAL | Status: DC
  Administered 2013-08-05 – 2013-08-06 (×2): 1 via ORAL
  Filled 2013-08-04 (×3): qty 1

## 2013-08-04 MED ORDER — SIMETHICONE 80 MG PO CHEW
80.0000 mg | CHEWABLE_TABLET | Freq: Three times a day (TID) | ORAL | Status: DC
  Administered 2013-08-04 – 2013-08-06 (×6): 80 mg via ORAL
  Filled 2013-08-04 (×6): qty 1

## 2013-08-04 MED ORDER — KETOROLAC TROMETHAMINE 60 MG/2ML IM SOLN
INTRAMUSCULAR | Status: AC
  Filled 2013-08-04: qty 2

## 2013-08-04 MED ORDER — WITCH HAZEL-GLYCERIN EX PADS: 1.0000 "application " | MEDICATED_PAD | CUTANEOUS | Status: DC | PRN

## 2013-08-04 MED ORDER — OXYTOCIN 10 UNIT/ML IJ SOLN
INTRAMUSCULAR | Status: AC
  Filled 2013-08-04: qty 4

## 2013-08-04 MED ORDER — SENNOSIDES-DOCUSATE SODIUM 8.6-50 MG PO TABS
2.0000 | ORAL_TABLET | ORAL | Status: DC
Start: 2013-08-05 — End: 2013-08-07
  Administered 2013-08-05 – 2013-08-07 (×3): 2 via ORAL
  Filled 2013-08-04 (×3): qty 2

## 2013-08-04 MED ORDER — ONDANSETRON HCL 4 MG/2ML IJ SOLN: 4.0000 mg | Freq: Three times a day (TID) | INTRAMUSCULAR | Status: DC | PRN

## 2013-08-04 MED ORDER — ONDANSETRON HCL 4 MG/2ML IJ SOLN
INTRAMUSCULAR | Status: AC
  Filled 2013-08-04: qty 2

## 2013-08-04 MED ORDER — FENTANYL CITRATE 0.05 MG/ML IJ SOLN: 25.0000 ug | INTRAMUSCULAR | Status: DC | PRN

## 2013-08-04 MED ORDER — NALBUPHINE HCL 10 MG/ML IJ SOLN
INTRAMUSCULAR | Status: AC
  Filled 2013-08-04: qty 1

## 2013-08-04 MED ORDER — OXYTOCIN 10 UNIT/ML IJ SOLN
40.0000 [IU] | INTRAMUSCULAR | Status: DC | PRN
  Administered 2013-08-04: 40 [IU] via INTRAVENOUS

## 2013-08-04 MED ORDER — KETOROLAC TROMETHAMINE 30 MG/ML IJ SOLN
30.0000 mg | Freq: Four times a day (QID) | INTRAMUSCULAR | Status: AC | PRN
  Administered 2013-08-04: 30 mg via INTRAVENOUS
  Filled 2013-08-04: qty 1

## 2013-08-04 MED ORDER — ZOLPIDEM TARTRATE 5 MG PO TABS: 5.0000 mg | ORAL_TABLET | Freq: Every evening | ORAL | Status: DC | PRN

## 2013-08-04 MED ORDER — DEXTROSE 5 % IV SOLN
1.0000 ug/kg/h | INTRAVENOUS | Status: DC | PRN
  Filled 2013-08-04: qty 2

## 2013-08-04 MED ORDER — PHENYLEPHRINE 8 MG IN D5W 100 ML (0.08MG/ML) PREMIX OPTIME
INJECTION | INTRAVENOUS | Status: AC
  Filled 2013-08-04: qty 100

## 2013-08-04 MED ORDER — MEASLES, MUMPS & RUBELLA VAC ~~LOC~~ INJ
0.5000 mL | INJECTION | Freq: Once | SUBCUTANEOUS | Status: DC
  Filled 2013-08-04: qty 0.5

## 2013-08-04 MED ORDER — ONDANSETRON HCL 4 MG/2ML IJ SOLN
INTRAMUSCULAR | Status: DC | PRN
  Administered 2013-08-04: 4 mg via INTRAVENOUS

## 2013-08-04 MED ORDER — ONDANSETRON HCL 4 MG PO TABS: 4.0000 mg | ORAL_TABLET | ORAL | Status: DC | PRN

## 2013-08-04 MED ORDER — MENTHOL 3 MG MT LOZG: 1.0000 | LOZENGE | OROMUCOSAL | Status: DC | PRN

## 2013-08-04 MED ORDER — SIMETHICONE 80 MG PO CHEW
80.0000 mg | CHEWABLE_TABLET | ORAL | Status: DC
  Administered 2013-08-05 – 2013-08-07 (×3): 80 mg via ORAL
  Filled 2013-08-04 (×3): qty 1

## 2013-08-04 MED ORDER — MEPERIDINE HCL 25 MG/ML IJ SOLN
INTRAMUSCULAR | Status: AC
  Filled 2013-08-04: qty 1

## 2013-08-04 MED ORDER — TETANUS-DIPHTH-ACELL PERTUSSIS 5-2.5-18.5 LF-MCG/0.5 IM SUSP: 0.5000 mL | Freq: Once | INTRAMUSCULAR | Status: DC

## 2013-08-04 MED ORDER — DIPHENHYDRAMINE HCL 50 MG/ML IJ SOLN
INTRAMUSCULAR | Status: DC | PRN
  Administered 2013-08-04: 25 mg via INTRAVENOUS

## 2013-08-04 MED ORDER — MORPHINE SULFATE 0.5 MG/ML IJ SOLN
INTRAMUSCULAR | Status: AC
  Filled 2013-08-04: qty 10

## 2013-08-04 MED ORDER — METOCLOPRAMIDE HCL 5 MG/ML IJ SOLN: 10.0000 mg | Freq: Once | INTRAMUSCULAR | Status: DC | PRN

## 2013-08-04 MED ORDER — METOCLOPRAMIDE HCL 5 MG/ML IJ SOLN: 10.0000 mg | Freq: Three times a day (TID) | INTRAMUSCULAR | Status: DC | PRN

## 2013-08-04 MED ORDER — LACTATED RINGERS IV SOLN
INTRAVENOUS | Status: DC | PRN
  Administered 2013-08-04: 08:00:00 via INTRAVENOUS

## 2013-08-04 MED ORDER — DIPHENHYDRAMINE HCL 50 MG/ML IJ SOLN
INTRAMUSCULAR | Status: AC
  Filled 2013-08-04: qty 1

## 2013-08-04 MED ORDER — MEPERIDINE HCL 25 MG/ML IJ SOLN
6.2500 mg | INTRAMUSCULAR | Status: DC | PRN
  Administered 2013-08-04: 12.5 mg via INTRAVENOUS

## 2013-08-04 MED ORDER — SODIUM CHLORIDE 0.9 % IJ SOLN: 3.0000 mL | Freq: Two times a day (BID) | INTRAMUSCULAR | Status: DC

## 2013-08-04 MED ORDER — SCOPOLAMINE 1 MG/3DAYS TD PT72
1.0000 | MEDICATED_PATCH | Freq: Once | TRANSDERMAL | Status: DC
  Filled 2013-08-04: qty 1

## 2013-08-04 MED ORDER — SIMETHICONE 80 MG PO CHEW: 80.0000 mg | CHEWABLE_TABLET | ORAL | Status: DC | PRN

## 2013-08-04 MED ORDER — FENTANYL CITRATE 0.05 MG/ML IJ SOLN
INTRAMUSCULAR | Status: DC | PRN
  Administered 2013-08-04: 25 ug via INTRATHECAL

## 2013-08-04 MED ORDER — LANOLIN HYDROUS EX OINT: 1.0000 "application " | TOPICAL_OINTMENT | CUTANEOUS | Status: DC | PRN

## 2013-08-04 SURGICAL SUPPLY — 32 items
CLAMP CORD UMBIL (MISCELLANEOUS) IMPLANT
CLIP FILSHIE TUBAL LIGA STRL (Clip) IMPLANT
CLOSURE WOUND 1/2 X4 (GAUZE/BANDAGES/DRESSINGS) ×1
CLOTH BEACON ORANGE TIMEOUT ST (SAFETY) ×3 IMPLANT
DRAPE LG THREE QUARTER DISP (DRAPES) IMPLANT
DRSG OPSITE POSTOP 4X10 (GAUZE/BANDAGES/DRESSINGS) ×3 IMPLANT
DURAPREP 26ML APPLICATOR (WOUND CARE) ×3 IMPLANT
ELECT LIGASURE SHORT 9 REUSE (ELECTRODE) ×2 IMPLANT
ELECT REM PT RETURN 9FT ADLT (ELECTROSURGICAL) ×3
ELECTRODE REM PT RTRN 9FT ADLT (ELECTROSURGICAL) ×1 IMPLANT
EXTRACTOR VACUUM M CUP 4 TUBE (SUCTIONS) IMPLANT
EXTRACTOR VACUUM M CUP 4' TUBE (SUCTIONS)
GAUZE SPONGE 4X4 12PLY STRL LF (GAUZE/BANDAGES/DRESSINGS) ×2 IMPLANT
GLOVE BIO SURGEON STRL SZ7 (GLOVE) ×3 IMPLANT
GOWN PREVENTION PLUS XLARGE (GOWN DISPOSABLE) ×6 IMPLANT
GOWN STRL REIN XL XLG (GOWN DISPOSABLE) ×6 IMPLANT
KIT ABG SYR 3ML LUER SLIP (SYRINGE) IMPLANT
NDL HYPO 25X5/8 SAFETYGLIDE (NEEDLE) ×1 IMPLANT
NEEDLE HYPO 25X5/8 SAFETYGLIDE (NEEDLE) ×3 IMPLANT
NS IRRIG 1000ML POUR BTL (IV SOLUTION) ×3 IMPLANT
PACK C SECTION WH (CUSTOM PROCEDURE TRAY) ×3 IMPLANT
PAD ABD 7.5X8 STRL (GAUZE/BANDAGES/DRESSINGS) ×2 IMPLANT
PAD OB MATERNITY 4.3X12.25 (PERSONAL CARE ITEMS) ×3 IMPLANT
STRIP CLOSURE SKIN 1/2X4 (GAUZE/BANDAGES/DRESSINGS) ×2 IMPLANT
SUT CHROMIC 0 CTX 36 (SUTURE) ×11 IMPLANT
SUT MON AB 4-0 PS1 27 (SUTURE) ×3 IMPLANT
SUT PDS AB 0 CT1 27 (SUTURE) ×6 IMPLANT
SUT VIC AB 3-0 CT1 27 (SUTURE) ×6
SUT VIC AB 3-0 CT1 TAPERPNT 27 (SUTURE) ×2 IMPLANT
TOWEL OR 17X24 6PK STRL BLUE (TOWEL DISPOSABLE) ×3 IMPLANT
TRAY FOLEY CATH 14FR (SET/KITS/TRAYS/PACK) ×3 IMPLANT
WATER STERILE IRR 1000ML POUR (IV SOLUTION) ×3 IMPLANT

## 2013-08-04 NOTE — Lactation Note (Signed)
This note was copied from the chart of [name] Zelma Snead. Lactation Consultation Note     Initial consult with this mom and baby, full term and now 52 hours old. The mom is doing skin to skin, after baby's bath, so the mom would like help with latching in about an hour. She has very large breasts, with a semi flt nipple on the rith, and a amll but evert nipple on the left. I was able to hand express a small drop of colostrum from each nipple. Mom reports having troubled latching her baby to her right breast. I reviewed lactation services and community services with mom. Lactation will follow up with mom later this evening.  Patient Name: [name] GYBWL'S Date: 08/04/2013 Reason for consult: Initial assessment   Maternal Data Formula Feeding for Exclusion: No Infant to breast within first hour of birth: Yes Does the patient have breastfeeding experience prior to this delivery?: Yes  Feeding Feeding Type: Breast Fed Length of feed: 15 min  LATCH Score/Interventions Latch: Repeated attempts needed to sustain latch, nipple held in mouth throughout feeding, stimulation needed to elicit sucking reflex. Intervention(s): Adjust position;Assist with latch;Breast compression  Audible Swallowing: None Intervention(s): Skin to skin  Type of Nipple: Everted at rest and after stimulation (simiflat, comes out with latch) Intervention(s): Reverse pressure  Comfort (Breast/Nipple): Soft / non-tender     Hold (Positioning): Assistance needed to correctly position infant at breast and maintain latch.  LATCH Score: 6  Lactation Tools Discussed/Used     Consult Status Consult Status: Follow-up Date: 08/04/13 Follow-up type: In-patient    Tonna Corner 08/04/2013, 6:24 PM

## 2013-08-04 NOTE — Anesthesia Procedure Notes (Signed)

## 2013-08-04 NOTE — Anesthesia Postprocedure Evaluation (Signed)
Anesthesia Post Note  Patient: [name]  Procedure(s) Performed: Procedure(s) (LRB): REPEAT CESAREAN SECTION WITH BILATERAL TUBAL LIGATION (Bilateral)  Anesthesia type: Spinal  Patient location: Mother/Baby  Post pain: Pain level controlled  Post assessment: Post-op Vital signs reviewed  Last Vitals:  Filed Vitals:   08/04/13 1510  BP: 106/72  Pulse: 93  Temp:   Resp:     Post vital signs: Reviewed  Level of consciousness: awake  Complications: No apparent anesthesia complications

## 2013-08-04 NOTE — Transfer of Care (Signed)
Immediate Anesthesia Transfer of Care Note  Patient: [name]  Procedure(s) Performed: Procedure(s) with comments: REPEAT CESAREAN SECTION WITH BILATERAL TUBAL LIGATION (Bilateral) - Repeat edc 08/11/13  Patient Location: PACU  Anesthesia Type:Spinal  Level of Consciousness: awake, alert  and oriented  Airway & Oxygen Therapy: Patient Spontanous Breathing and Patient connected to nasal cannula oxygen  Post-op Assessment: Report given to PACU RN and Post -op Vital signs reviewed and stable  Post vital signs: Reviewed and stable  Complications: No apparent anesthesia complications

## 2013-08-04 NOTE — Anesthesia Preprocedure Evaluation (Addendum)
Anesthesia Evaluation  Patient identified by MRN, date of birth, ID band Patient awake    Reviewed: Allergy & Precautions, H&P , NPO status , Patient's Chart, lab work & pertinent test results, reviewed documented beta blocker date and time   History of Anesthesia Complications Negative for: history of anesthetic complications  Airway Mallampati: III TM Distance: >3 FB Neck ROM: full    Dental  (+) Teeth Intact   Pulmonary neg pulmonary ROS,  breath sounds clear to auscultation        Cardiovascular negative cardio ROS  Rhythm:regular Rate:Normal     Neuro/Psych negative neurological ROS  negative psych ROS   GI/Hepatic Neg liver ROS, GERD-  ,  Endo/Other  Morbid obesity  Renal/GU negative Renal ROS  Female GU complaint     Musculoskeletal   Abdominal   Peds  Hematology  (+) anemia ,   Anesthesia Other Findings   Reproductive/Obstetrics (+) Pregnancy (h/o c/s x1, for repeat and bilateral salpingectomy)                          Anesthesia Physical Anesthesia Plan  ASA: II  Anesthesia Plan: Spinal   Post-op Pain Management:    Induction:   Airway Management Planned:   Additional Equipment:   Intra-op Plan:   Post-operative Plan:   Informed Consent: I have reviewed the patients History and Physical, chart, labs and discussed the procedure including the risks, benefits and alternatives for the proposed anesthesia with the patient or authorized representative who has indicated his/her understanding and acceptance.     Plan Discussed with: CRNA and Surgeon  Anesthesia Plan Comments:         Anesthesia Quick Evaluation

## 2013-08-04 NOTE — Progress Notes (Signed)
The patient was re-examined with no change in status 

## 2013-08-04 NOTE — Anesthesia Postprocedure Evaluation (Signed)
  Anesthesia Post-op Note  Patient: [name]  Procedure(s) Performed: Procedure(s) with comments: REPEAT CESAREAN SECTION WITH BILATERAL TUBAL LIGATION (Bilateral) - Repeat edc 08/11/13  Patient Location: PACU  Anesthesia Type:Spinal  Level of Consciousness: awake, alert  and oriented  Airway and Oxygen Therapy: Patient Spontanous Breathing  Post-op Pain: none  Post-op Assessment: Post-op Vital signs reviewed, Patient's Cardiovascular Status Stable, Respiratory Function Stable, Patent Airway, No signs of Nausea or vomiting, Pain level controlled, No headache and No backache  Post-op Vital Signs: Reviewed and stable  Complications: No apparent anesthesia complications

## 2013-08-04 NOTE — Consult Note (Signed)
Neonatology Note:   Attendance at C-section:    I was asked by Dr. Matthew Saras to attend this repeat C/S at term. The mother is a G3P1A1 O pos, GBS neg with an uncomplicated pregnancy. ROM at delivery, fluid clear. Infant vigorous with good spontaneous cry and tone. Needed only minimal bulb suctioning. Ap 9/9. Lungs clear to ausc in DR. To CN to care of Pediatrician.   Real Cons, MD

## 2013-08-04 NOTE — Op Note (Signed)
Preoperative diagnosis: Repeat cesarean section at term, request permanent sterilization  Postoperative diagnosis: Same  Procedure: Repeat low transverse cesarean section, bilateral salpingectomy for sterilization  Surgeon: Matthew Saras  Anesthesia: Spinal  EBL: 700 cc  Drains: Foley,  Specimens removed: Placenta to labor and delivery, bilateral salpingectomy to pathology  Procedure and findings:  The patient taken the operating room after an adequate level of spinal anesthetic was obtained with the patient in left tilt position the abdomen prepped and draped in the usual fashion, Foley catheter was positioned. Appropriate timeout for taken at that point. After prepping and draping, transverse incision was made excising the old scar, this is carried down to the fascia which was incised and extended transversely. Rectus muscles divided in the midline, peritoneum entered superiorly without incident and extended in a vertical fashion. The vesicouterine serosa was incised and the bladder was bluntly and sharply dissected below, bladder blade was repositioned. Transverse incision made in lower segment extended with blunt dissection clear fluid noted the patient then delivered of a healthy female, the infant was suctioned cord clamped and passed the pediatric team for further care. The placenta was then delivered manually intact, uterus exteriorized cavity wiped clean with a laparotomy pack closure obtained with the first layer of 0 chromic in a locked fashion followed by an imbricating layer of 0 chromic. This is hemostatic the bladder flap area was intact and hemostatic. Bilateral tubes and ovaries appeared to be normal. Using the handheld LigaSure device the right tube was coagulated and divided close to the tube out the mesial salpinx up to the origin at the uterus with excellent hemostasis. The exact same repeated on the opposite side both of these areas were reinspected and noted be hemostatic. The uterine  incision was hemostatic, uterus returned its intra-abdominal position. Irrigated and aspirated operative sites noted be hemostatic. Prior to closure sponge needle instrument counts reported as correct x2. Peritoneum was enclose a running 2-0 Vicryl suture. Rectus muscles reapproximated in the midline with a running 2-0 Vicryl suture. Fascia closed with a double-lumen 0 PDS suture. Subcutaneous tissue was irrigated noted be hemostatic reapproximated with a running 3-0 Vicryl suture. 4-0 Monocryl subcuticular closure with pressure dressing clear urine noted in the case she tolerated this well went to recovery in good condition.  Dictated with [facility]  [name] the

## 2013-08-05 ENCOUNTER — Encounter (HOSPITAL_COMMUNITY): Payer: Self-pay | Admitting: Obstetrics and Gynecology

## 2013-08-05 LAB — CBC
HEMATOCRIT: 30.4 % — AB (ref 36.0–46.0)
Hemoglobin: 9.6 g/dL — ABNORMAL LOW (ref 12.0–15.0)
MCH: 25.3 pg — AB (ref 26.0–34.0)
MCHC: 31.6 g/dL (ref 30.0–36.0)
MCV: 80.2 fL (ref 78.0–100.0)
PLATELETS: 227 10*3/uL (ref 150–400)
RBC: 3.79 MIL/uL — ABNORMAL LOW (ref 3.87–5.11)
RDW: 15.8 % — AB (ref 11.5–15.5)
WBC: 13.4 10*3/uL — ABNORMAL HIGH (ref 4.0–10.5)

## 2013-08-05 LAB — BIRTH TISSUE RECOVERY COLLECTION (PLACENTA DONATION)

## 2013-08-05 LAB — CCBB MATERNAL DONOR DRAW

## 2013-08-05 NOTE — Lactation Note (Signed)
This note was copied from the chart of Boy Lakayla Barrington. Lactation Consultation Note Follow up consult:  Baby boy 6 hours old.  Mother requested assistance with breastfeeding since she is having difficulty latching baby on the right breast.  Mother put baby in football hold position on right side with ease.  Reviewed hand expression which helped evert her nipple.  Baby then latched on right with intermittent sucks and audible swallows.  Recommended that if mom has trouble latching baby, try hand expression first or some hand pumping.  Mom did complain of soreness on the left nipple, reviewed deep wide latch and gave mother comfort gels.  Reviewed breast massage, feeding on cue and lactation support services.  Encouraged mother to call for questions or further assistance.   Patient Name: Boy Gift Rueckert [id number] Date: 08/05/2013 Reason for consult: Follow-up assessment   Maternal Data Has patient been taught Hand Expression?: Yes Does the patient have breastfeeding experience prior to this delivery?: No  Feeding Feeding Type: Breast Fed Length of feed: 15 min  LATCH Score/Interventions Latch: Grasps breast easily, tongue down, lips flanged, rhythmical sucking. Intervention(s): Assist with latch;Breast massage;Breast compression  Audible Swallowing: Spontaneous and intermittent Intervention(s): Hand expression  Type of Nipple: Everted at rest and after stimulation Intervention(s): Hand pump  Comfort (Breast/Nipple): Filling, red/small blisters or bruises, mild/mod discomfort     Hold (Positioning): Assistance needed to correctly position infant at breast and maintain latch. Intervention(s): Breastfeeding basics reviewed;Support Pillows  LATCH Score: 8  Lactation Tools Discussed/Used Tools: Pump;Comfort gels Breast pump type: Manual   Consult Status Consult Status: Follow-up Date: 08/06/13 Follow-up type: In-patient    Vivianne Master [facility] 08/05/2013, 9:36  AM

## 2013-08-05 NOTE — Progress Notes (Cosign Needed)
Subjective: Postpartum Day 1: Cesarean Delivery Patient reports tolerating PO and no problems voiding.    Objective: Vital signs in last 24 hours: Temp:  [97.4 F (36.3 C)-98.6 F (37 C)] 97.9 F (36.6 C) (01/21 0555) Pulse Rate:  [67-94] 70 (01/21 0555) Resp:  [14-20] 18 (01/21 0555) BP: (98-118)/(43-85) 98/43 mmHg (01/21 0555) SpO2:  [97 %-100 %] 99 % (01/21 0300) Weight:  [225 lb (102.059 kg)] 225 lb (102.059 kg) (01/20 1030)  Physical Exam:  General: alert and cooperative Lochia: appropriate Uterine Fundus: firm Incision: abd dressing removed and honeycomb dressing CDI DVT Evaluation: No evidence of DVT seen on physical exam. Negative Homan's sign. No cords or calf tenderness.   Recent Labs  08/03/13 0905 08/05/13 0615  HGB 10.9* 9.6*  HCT 33.9* 30.4*    Assessment/Plan: Status post Cesarean section. Doing well postoperatively.  Desires circ prior to discharge.  Claudean Leavelle G 08/05/2013, 8:35 AM

## 2013-08-06 NOTE — Lactation Note (Signed)
This note was copied from the chart of [name]. Lactation Consultation Note Follow up visit requested by mom.  She is attempting latch in football hold.  She reports initial pain with latch on, but improved now.  Baby has wide flanged lips with suckling burst and swallows audible.  Discussed using pillows and blankets for support to keep baby close with chin, cheeks and nose close to breast.  Encouraged mom to stimulated baby with breast massage and shoulder rubs to keep baby active. Mom does have a positional stripe when baby comes off, encouraged deep latch. Mom is able to do a return demonstration with colostrum visible and rubbed into nipples and mom will continue to work on that.  Mom to call for assist as needed.   Patient Name: [name] [id number] Date: 08/06/2013 Reason for consult: Follow-up assessment;Difficult latch;Breast/nipple pain   Maternal Data    Feeding Feeding Type: Breast Fed Length of feed:  (more than 5 minutes observed with swallows)  LATCH Score/Interventions Latch: Grasps breast easily, tongue down, lips flanged, rhythmical sucking. Intervention(s): Breast massage;Breast compression;Assist with latch;Adjust position  Audible Swallowing: Spontaneous and intermittent Intervention(s): Hand expression;Skin to skin  Type of Nipple: Everted at rest and after stimulation Intervention(s): Hand pump  Comfort (Breast/Nipple): Filling, red/small blisters or bruises, mild/mod discomfort  Problem noted: Mild/Moderate discomfort;Filling Interventions (Mild/moderate discomfort): Comfort gels  Hold (Positioning): No assistance needed to correctly position infant at breast. Intervention(s): Breastfeeding basics reviewed;Support Pillows;Position options;Skin to skin  LATCH Score: 9  Lactation Tools Discussed/Used Tools: Comfort gels   Consult Status Consult Status: Follow-up Date: 08/07/13 Follow-up type: In-patient    Shop[place]w, Justine Null 08/06/2013, 5:50 PM

## 2013-08-06 NOTE — Lactation Note (Signed)
This note was copied from the chart of [name] Notnamed Croucher. Lactation Consultation Note Follow up visit at  35 hours of age.  Baby is in mom's arm with a pacifier and visitor is questioning about cluster feeding.  Discussed pacifier use is covering feeding cues and causes nipple pain with breast feeding.  Mom is experiencing nipple pain and starting to use comfort gels previously given to her. Discussed feeding cues and cluster feeding in detail. Offered encouragement to mom and suggested skin to skin with feedings as mom reports baby falls asleep after a few minutes. Mom reports knowing how to do hand expression, but she is not seeing much.  Offered to assist, mom declines.  Previous latch scores of 9 and adequate output.  Mom to call for assist as needed.   Patient Name: [name] [id number] Date: 08/06/2013 Reason for consult: Follow-up assessment   Maternal Data    Feeding Feeding Type: Breast Fed Length of feed: 5 min  LATCH Score/Interventions Latch: Grasps breast easily, tongue down, lips flanged, rhythmical sucking.  Audible Swallowing: Spontaneous and intermittent  Type of Nipple: Everted at rest and after stimulation  Comfort (Breast/Nipple): Filling, red/small blisters or bruises, mild/mod discomfort  Problem noted: Mild/Moderate discomfort Interventions (Mild/moderate discomfort): Comfort gels  Hold (Positioning): No assistance needed to correctly position infant at breast. Intervention(s): Breastfeeding basics reviewed  LATCH Score: 9  Lactation Tools Discussed/Used Tools: Comfort gels   Consult Status Consult Status: Follow-up Date: 08/07/13 Follow-up type: In-patient    Justice Britain 08/06/2013, 4:43 PM

## 2013-08-06 NOTE — Progress Notes (Signed)
Subjective: Postpartum Day 2: Cesarean Delivery Patient reports tolerating PO, + flatus and no problems voiding.    Objective: Vital signs in last 24 hours: Temp:  [97.6 F (36.4 C)-98.4 F (36.9 C)] 97.6 F (36.4 C) (01/22 0654) Pulse Rate:  [68-73] 68 (01/22 0654) Resp:  [17] 17 (01/22 0654) BP: (104-112)/(63-68) 112/68 mmHg (01/22 0654) SpO2:  [98 %-99 %] 99 % (01/22 0654)  Physical Exam:  General: alert and cooperative Lochia: appropriate Uterine Fundus: firm Incision: small old drainage noted on bandage DVT Evaluation: No evidence of DVT seen on physical exam. Negative Homan's sign. No cords or calf tenderness.   Recent Labs  08/03/13 0905 08/05/13 0615  HGB 10.9* 9.6*  HCT 33.9* 30.4*    Assessment/Plan: Status post Cesarean section. Doing well postoperatively.  Desires circ prior to discharge.  Perle Gibbon G 08/06/2013, 8:38 AM

## 2013-08-06 NOTE — Clinical Social Work Maternal (Addendum)
Clinical Social Work Department PSYCHOSOCIAL ASSESSMENT - MATERNAL/CHILD 08/06/2013  Patient:  [name], [name]  Account Number:  [id number]  Admit Date:  08/04/2013  Ardine Eng Name:   Lezlie Lye Phiffer    Clinical Social Worker:  Gerri Spore, LCSW   Date/Time:  08/06/2013 11:27 AM  Date Referred:  08/06/2013   Referral source  CN  CN     Referred reason  Substance Abuse   Other referral source:    I:  FAMILY / HOME ENVIRONMENT Child's legal guardian:  PARENT  Guardian - Name Guardian - Age Guardian - Address  Kseniya Grunden [address] [place], [place] [address]  [facility], Brooke Bonito. 24    Other household support members/support persons Name Relationship DOB  Carey 08/2010   Other support:    II  PSYCHOSOCIAL DATA Information Source:  Patient Interview  Museum/gallery curator and Community Resources Employment:      Museum/gallery curator resources:  Multimedia programmer If [place]:  [place] Other  [place] / Grade:   Maternity Care Coordinator / Child Services Coordination / Early Interventions:  Cultural issues impacting care:    III  STRENGTHS Strengths  Adequate Resources  Home prepared for Child (including basic supplies)  Supportive family/friends   Strength comment:    IV  RISK FACTORS AND CURRENT PROBLEMS Current Problem:  YES   Risk Factor & Current Problem Patient Issue Family Issue Risk Factor / Current Problem Comment  Substance Abuse Y N Hx of Etoh abuse    V  SOCIAL WORK ASSESSMENT CSW referral received to assess pt's history of alcohol abuse, noted in chart.  CSW met with pt in her 1st floor hospital room.  She was accompanied by FOB & gave CSW verbal permission to speak in his presence.  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  She never participated in any substance abuse treatment programs however did attend counseling services for "a couple of months."  Pt  reports that counseling services were helpful but attributes her decreased alcohol use to "will power." Pt is not sober but has decreased use.  Prior to pregnancy confirmation at 8 weeks, she admits to drinking liquor "once every 2 1/2 weeks." She denies any illegal substance use.  Drug screens not ordered.  FOB was at the bedside, aware of her history & supportive.  The pt was pleasant to speak with & cooperative.  She has all the necessary supplies for the infant & good family support.  CSW discussed signs/symptoms of PP depression & encouraged her to seek medical attention if necessary.  Pt denies any history of depression or SI. She appears receptive to information discussed.      VI SOCIAL WORK PLAN Social Work Plan  No Further Intervention Required / No Barriers to Discharge   Type of pt/family education:   If child protective services report - county:   If child protective services report - date:   Information/referral to community resources comment:   Other social work plan:

## 2013-08-07 ENCOUNTER — Ambulatory Visit: Payer: Self-pay

## 2013-08-07 LAB — TYPE AND SCREEN
ABO/RH(D): O POS
ANTIBODY SCREEN: NEGATIVE
UNIT DIVISION: 0
UNIT DIVISION: 0

## 2013-08-07 MED ORDER — OXYCODONE-ACETAMINOPHEN 5-325 MG PO TABS: 1.0000 | ORAL_TABLET | Freq: Four times a day (QID) | ORAL | Status: AC | PRN

## 2013-08-07 MED ORDER — IBUPROFEN 800 MG PO TABS: 800.0000 mg | ORAL_TABLET | Freq: Three times a day (TID) | ORAL | Status: AC | PRN

## 2013-08-07 NOTE — Discharge Summary (Signed)
Obstetric Discharge Summary Reason for Admission: cesarean section and BTL Prenatal Procedures: ultrasound Intrapartum Procedures: cesarean: low cervical, transverse Postpartum Procedures: none Complications-Operative and Postpartum: none Hemoglobin  Date Value Range Status  08/05/2013 9.6* 12.0 - 15.0 g/dL Final     HCT  Date Value Range Status  08/05/2013 30.4* 36.0 - 46.0 % Final    Physical Exam:  General: alert and cooperative Lochia: appropriate Uterine Fundus: firm Incision: healing well DVT Evaluation: No evidence of DVT seen on physical exam. Negative Homan's sign. No cords or calf tenderness.  Discharge Diagnoses: Term Pregnancy-delivered  Discharge Information: Date: 08/07/2013 Activity: pelvic rest Diet: routine Medications: PNV, Ibuprofen and Percocet Condition: stable Instructions: refer to practice specific booklet Discharge to: home   Newborn Data: Live born female  Birth Weight: 7 lb 8.1 oz (3405 g) APGAR: 9, 9  Home with mother.  CURTIS,CAROL G 08/07/2013, 8:20 AM

## 2013-08-07 NOTE — Lactation Note (Signed)
This note was copied from the chart of [name]. Lactation Consultation Note  Patient Name: [name] [id number] Date: 08/07/2013 Reason for consult: Follow-up assessment  Baby 50 hours old. Baby already in carseat ready for discharge. Mom reports a better/deeper latch, and nipples not as sore as before. Gave additional set of comfort gels with instructions. Discussed engorgement prevention/treatment. Mom has hand pump. Reviewed basics. Mom aware of OP/BFSG services.  Maternal Data    Feeding Feeding Type: Breast Fed Length of feed: 11 min  LATCH Score/Interventions                      Lactation Tools Discussed/Used     Consult Status Consult Status: Complete    Genella Bas 08/07/2013, 9:57 AM

## 2013-08-15 ENCOUNTER — Inpatient Hospital Stay (HOSPITAL_COMMUNITY)
Admission: AD | Admit: 2013-08-15 | Discharge: 2013-08-15 | Disposition: A | Discharge status: Home / Self Care | Payer: Commercial Managed Care - PPO | Source: Ambulatory Visit | Attending: Obstetrics and Gynecology | Admitting: Obstetrics and Gynecology

## 2013-08-15 ENCOUNTER — Encounter (HOSPITAL_COMMUNITY): Payer: Self-pay | Admitting: *Deleted

## 2013-08-15 DIAGNOSIS — O9122 Nonpurulent mastitis associated with the puerperium: Secondary | ICD-10-CM | POA: Insufficient documentation

## 2013-08-15 DIAGNOSIS — N61 Mastitis without abscess: Secondary | ICD-10-CM

## 2013-08-15 DIAGNOSIS — O864 Pyrexia of unknown origin following delivery: Secondary | ICD-10-CM | POA: Insufficient documentation

## 2013-08-15 LAB — URINALYSIS, ROUTINE W REFLEX MICROSCOPIC
GLUCOSE, UA: NEGATIVE mg/dL
Ketones, ur: 15 mg/dL — AB
Nitrite: NEGATIVE
PROTEIN: 100 mg/dL — AB
Specific Gravity, Urine: 1.025 (ref 1.005–1.030)
UROBILINOGEN UA: 0.2 mg/dL (ref 0.0–1.0)
pH: 6 (ref 5.0–8.0)

## 2013-08-15 LAB — URINE MICROSCOPIC-ADD ON

## 2013-08-15 MED ORDER — CEPHALEXIN 500 MG PO CAPS: 500.0000 mg | ORAL_CAPSULE | Freq: Four times a day (QID) | ORAL | Status: AC

## 2013-08-15 NOTE — MAU Note (Signed)
Patient states she is breastfeeding and both nipples are abraded and sore so she is pumping for now. Also states the outer quadrant of her left breast hurts but states that she had a lump removed under this spot on her breast.

## 2013-08-15 NOTE — MAU Note (Signed)
Patient presents stating that she has a fever. Contacted her physician's office. Was instructed to come to MAU to be evaluated as she was sectioned on the 20th.

## 2013-08-15 NOTE — Lactation Note (Signed)
Lactation Consultation Note  Patient Name: [name] IOMBT'D Date: 08/15/2013   [facility] called for consult to MAU.  Pt s/p C-Section on 08/04/13, called for mastitis evaluation; history of fever of 101.  Infant now 72 days old; infant not with mom in MAU.  RN got mom pumping prior to [facility] visit.  Mom was able to remove 75 ml from left breast & 27 ml from right.  Reports stopped pumping here in MAU a few minutes ago because of feeling light-headed and faint.  Upon assessment, right breast is very red in all quadrants with large nodular areas above nipple/areola (midline upper quadrants).  Both nipple have abrasions and mom reports pain with latching.  Mom has history of lumpectomy on left breast but no redness or nodular areas noted; mom reports minor discomfort in outer quadrant with pumping on left breast. Both nipples are pink in color, dry skin noted with abrasions on nipples.  Encouraged mom to put expressed breast milk on nipples to help with dryness.  Mom stated latching issues with infant.  Discussed with mom how painful latches can cause engorgement which can lead to mastitis.  Encouraged mom to work on [place] with flanged lips, ice breasts before latching or pumping, and to continue taking ibuprofen to help decrease inflammation.  Discussed the importance of allowing baby to empty first breast when feeding and to use pump if needed on second breast for comfort.  Outpatient appointment made for earliest appointment available:  Friday, Feb 6 @ 9:00 am.  Encouraged mom to call for questions or to check on possible cancellations of other appointment for an earlier appointment time. LC helped mom begin pumping again on right side and encouraged using hands-on pumping to maximize milk removal.  Mom had pumped an additional 10+ ml prior to [facility] leaving room from right breast.  Spoke with CNM in MAU about assessment and plan of care.  Merlene Laughter 08/15/2013, 12:27 PM

## 2013-08-15 NOTE — MAU Provider Note (Signed)
History     CSN: [id number]  Arrival date and time: 08/15/13 4098   First Provider Initiated Contact with Patient 08/15/13 1039      Chief Complaint  Patient presents with  . Fever   HPI This is a 23 y.o. female who is post C/S and c/o fever, redness and soreness in her breasts, mostly on right. Has some pain on right, near old biopsy location.  Has been having trouble with latch and has switched to pumping. C/S was on 08/04/13  RN Note:  Patient states she is breastfeeding and both nipples are abraded and sore so she is pumping for now. Also states the outer quadrant of her left breast hurts but states that she had a lump removed under this spot on her breast.       OB History   Grav Para Term Preterm Abortions TAB SAB Ect Mult Living   3 2 2  1  1   2       Past Medical History  Diagnosis Date  . HPV (human papilloma virus) anogenital infection   . Mass of breast, left 11/2012  . Complication of anesthesia     loss of sensation in part of tongue after wisdom teeth extraction; loss of sensation at c-section incision  . Family history of anesthesia complication     father had loss of sensation at surgical site  . Gastroesophageal reflux in pregancy 2014    no meds - diet controlled    Past Surgical History  Procedure Laterality Date  . Wisdom tooth extraction    . Cesarean section  09/01/2010    [place]  . Laser ablation condolamata  02/08/2012    CO2 laser vaporization vulvar condyloma  . Breast biopsy Left 12/10/2012    Procedure: BREAST BIOPSY WITH NEEDLE LOCALIZATION;  Surgeon: Haywood Lasso, MD;  Location: [facility];  Service: General;  Laterality: Left;  local anethesia with monitored care  . Cesarean section with bilateral tubal ligation Bilateral 08/04/2013    Procedure: REPEAT CESAREAN SECTION WITH BILATERAL TUBAL LIGATION;  Surgeon: Margarette Asal, MD;  Location: [facility];  Service: Obstetrics;  Laterality: Bilateral;  Repeat edc 08/11/13     Family History  Problem Relation Age of Onset  . Diabetes Maternal Grandfather   . Hyperlipidemia Father   . Thyroid disease Father     History  Substance Use Topics  . Smoking status: Never Smoker   . Smokeless tobacco: Never Used  . Alcohol Use: Yes     Comment: occasionally but none with pregnancy    Allergies: No Known Allergies  Prescriptions prior to admission  Medication Sig Dispense Refill  . ibuprofen (ADVIL,MOTRIN) 800 MG tablet Take 1 tablet (800 mg total) by mouth every 8 (eight) hours as needed for moderate pain.  30 tablet  1  . oxyCODONE-acetaminophen (PERCOCET/ROXICET) 5-325 MG per tablet Take 1-2 tablets by mouth every 6 (six) hours as needed for moderate pain.  30 tablet  0  . Prenat w/o A-FeCbGl-DSS-FA-DHA (CITRANATAL 90 DHA) 90-1 & 300 MG MISC Take 1 capsule by mouth daily.        Review of Systems  Constitutional: Positive for fever, chills and malaise/fatigue.  Gastrointestinal: Positive for abdominal pain (slight pain at incision). Negative for nausea and vomiting.  Genitourinary: Negative for dysuria.  Neurological: Negative for dizziness and headaches.   Physical Exam   Blood pressure 108/71, pulse 113, temperature 98.6 F (37 C), temperature source Oral, resp. rate 18, height  5\' 5"  (1.651 m), weight 91.627 kg (202 lb), last menstrual period 11/09/2012, currently breastfeeding.  Physical Exam  Constitutional: She is oriented to person, place, and time. She appears well-developed and well-nourished. No distress.  HENT:  Head: Normocephalic.  Cardiovascular: Normal rate.   Respiratory: Effort normal.  GI: Soft. There is tenderness (over incision, mildly tender). There is no rebound and no guarding.  Incision healing well  Genitourinary: There is breast tenderness. No breast swelling or bleeding.  Musculoskeletal: Normal range of motion.  Neurological: She is alert and oriented to person, place, and time.  Skin: Skin is warm and dry.   Psychiatric: She has a normal mood and affect.  Left breast soft and nontender, no erethema Right breast is erethematous, engorged. Ernestina Penna is across top and sides.  Some cracking of nipples.  MAU Course  Procedures  MDM Discussed with Dr Helane Rima. Will treat with antibiotics Lactation consulted and they came to see patient.  Assessment and Plan  A:  Mastitis, mostly on right       Breastfeeding issues  P:  Discharge home          Medication List         cephALEXin 500 MG capsule  Commonly known as:  KEFLEX  Take 1 capsule (500 mg total) by mouth 4 (four) times daily.     CITRANATAL 90 DHA 90-1 & 300 MG Misc  Take 1 capsule by mouth daily.     ibuprofen 800 MG tablet  Commonly known as:  ADVIL,MOTRIN  Take 1 tablet (800 mg total) by mouth every 8 (eight) hours as needed for moderate pain.     oxyCODONE-acetaminophen 5-325 MG per tablet  Commonly known as:  PERCOCET/ROXICET  Take 1-2 tablets by mouth every 6 (six) hours as needed for moderate pain.        Follow-up Information   Schedule an appointment as soon as possible for a visit with Cyril Mourning, MD.   Specialty:  Obstetrics and Gynecology   Contact information:   [place]  [id number] [phone]       [facility]                  08/15/2013, 6:47 PM    [facility] 08/15/2013, 11:05 AM

## 2013-08-15 NOTE — Discharge Instructions (Signed)
Breastfeeding and Mastitis °Mastitis is inflammation of the breast tissue. It can occur in women who are breastfeeding. This can make breastfeeding painful. Mastitis will sometimes go away on its own. Your health care provider will help determine if treatment is needed. °CAUSES °Mastitis is often associated with a blocked milk (lactiferous) duct. This can happen when too much milk builds up in the breast. Causes of excess milk in the breast can include: °· Poor latch-on. If your baby is not latched onto the breast properly, she or he may not empty your breast completely while breastfeeding. °· Allowing too much time to pass between feedings. °· Wearing a bra or other clothing that is too tight. This puts extra pressure on the lactiferous ducts so milk does not flow through them as it should. °Mastitis can also be caused by a bacterial infection. Bacteria may enter the breast tissue through cuts or openings in the skin. In women who are breastfeeding, this may occur because of cracked or irritated skin. Cracks in the skin are often caused when your baby does not latch on properly to the breast. °SIGNS AND SYMPTOMS °· Swelling, redness, tenderness, and pain in an area of the breast. °· Swelling of the glands under the arm on the same side. °· Fever may or may not accompany mastitis. °If an infection is allowed to progress, a collection of pus (abscess) may develop. °DIAGNOSIS  °Your health care provider can usually diagnose mastitis based on your symptoms and a physical exam. Tests may be done to help confirm the diagnosis. These may include: °· Removal of pus from the breast by applying pressure to the area. This pus can be examined in the lab to determine which bacteria are present. If an abscess has developed, the fluid in the abscess can be removed with a needle. This can also be used to confirm the diagnosis and determine the bacteria present. In most cases, pus will not be present. °· Blood tests to determine if  your body is fighting a bacterial infection. °· Mammogram or ultrasound tests to rule out other problems or diseases. °TREATMENT  °Mastitis that occurs with breastfeeding will sometimes go away on its own. Your health care provider may choose to wait 24 hours after first seeing you to decide whether a prescription medicine is needed. If your symptoms are worse after 24 hours, your health care provider will likely prescribe an antibiotic to treat the mastitis. He or she will determine which bacteria are most likely causing the infection and will then select an appropriate antibiotic. This is sometimes changed based on the results of tests performed to identify the bacteria, or if there is no response to the antibiotic selected. Antibiotics are usually given by mouth. You may also be given medicine for pain. °HOME CARE INSTRUCTIONS °· Only take over-the-counter or prescription medicines for pain, fever, or discomfort as directed by your health care provider. °· If your health care provider prescribed an antibiotic, take the medicine as directed. Make sure you finish it even if you start to feel better. °· Do not wear a tight or underwire bra. Wear a soft, supportive bra. °· Increase your fluid intake, especially if you have a fever. °· Continue to empty the breast. Your health care provider can tell you whether this milk is safe for your infant or needs to be thrown out. You may be told to stop nursing until your health care provider thinks it is safe for your baby. Use a breast pump if   you are advised to stop nursing.  Keep your nipples clean and dry.  Empty the first breast completely before going to the other breast. If your baby is not emptying your breasts completely for some reason, use a breast pump to empty your breasts.  If you go back to work, pump your breasts while at work to stay in time with your nursing schedule.  Avoid allowing your breasts to become overly filled with milk (engorged). SEEK  MEDICAL CARE IF:  You have pus-like discharge from the breast.  Your symptoms do not improve with the treatment prescribed by your health care provider within 2 days. SEEK IMMEDIATE MEDICAL CARE IF:  Your pain and swelling are getting worse.  You have pain that is not controlled with medicine.  You have a red line extending from the breast toward your armpit.  You have a fever or persistent symptoms for more than 2 3 days.  You have a fever and your symptoms suddenly get worse. MAKE SURE YOU:   Understand these instructions.  Will watch your condition.  Will get help right away if you are not doing well or get worse. Document Released: 10/27/2004 Document Revised: 03/04/2013 Document Reviewed: 02/05/2013 [facility] Patient Information 2014 [place], [place].

## 2013-08-16 LAB — URINE CULTURE: Colony Count: 85000

## 2013-08-21 ENCOUNTER — Ambulatory Visit (HOSPITAL_COMMUNITY)
Admit: 2013-08-21 | Discharge: 2013-08-21 | Disposition: A | Discharge status: Home / Self Care | Payer: Commercial Managed Care - PPO | Attending: Obstetrics and Gynecology | Admitting: Obstetrics and Gynecology

## 2013-08-21 NOTE — Lactation Note (Signed)
Adult Lactation Consultation Outpatient Visit Note  Patient Name: [name](mother)        BABY: [name] Date of Birth: [birth year]                                 DOB: [birth year] Gestational Age at Delivery: TERM                  BIRTH WEIGHT: 7-8.1 Type of Delivery: C/S                                        DISCHARGE WEIGHT: 6-12.6                                                                            WEIGHT TODAY: 7-11.7 Breastfeeding History: Frequency of Breastfeeding: ATTEMPTS Length of Feeding:  Voids: QS Stools: QS  Supplementing / Method:EBM 2-4 OZ EVERY 2-3 HOURS Pumping:  Type of Pump:MEDELA   Frequency:EVERY 2-3 HOURS  Volume:  30-60 MLS RIGHT; 90 MLS LEFT  Comments:    Consultation Evaluation:Mom was seen in MAU 6 days ago for right breast mastitis.  She is still taking her antibiotics and she is feeling much better.  Mom states baby has a difficult time latching so she is pumping and bottle feeding.  On exam small area light pink on right breast where mastitis is resolving.  Area is non tender.  Attempted to assist with latching baby onto right breast.  Milk is leaking.  After several attempts baby not able to latch so 24 mm nipple shield used.  Baby latched easily and well and nursed actively for 20 minutes with many audible swallows.  After baby finished right breast he was able to latch easily to left breast easily and nursed additional 10 minutes.  Baby came off second breast content and transferred 122 mls.  Encouraged support group and encouraged to call [facility] office prn.  Initial Feeding Assessment:20 MINUTES RIGHT:10 MINUTES LEFT Pre-feed Weight:3508 Post-feed Weight:3630 Amount Transferred:122 MLS Comments:  Additional Feeding Assessment: Pre-feed Weight: Post-feed Weight: Amount Transferred: Comments:  Additional Feeding Assessment: Pre-feed Weight: Post-feed Weight: Amount Transferred: Comments:  Total Breast milk Transferred this Visit:  122 MLS Total Supplement Given: NONE  Additional Interventions:   Follow-Up  WEIGHT CHECK NEXT WEEK AT [facility].  WILL CALL LC OFFICE PRN.      Franki Monte 08/21/2013, 9:59 AM

## 2014-05-17 ENCOUNTER — Encounter (HOSPITAL_COMMUNITY): Payer: Self-pay | Admitting: *Deleted

## 2015-02-14 ENCOUNTER — Other Ambulatory Visit: Payer: Self-pay | Admitting: Obstetrics and Gynecology

## 2015-02-15 LAB — CYTOLOGY - PAP
